# Patient Record
Sex: Female | Born: 1965 | Race: Black or African American | Hispanic: No | Marital: Single | Smoking: Never smoker
Health system: Southern US, Community
[De-identification: ages and names within clinical notes are randomized; demographics above are authoritative.]

## PROBLEM LIST (undated history)

## (undated) DIAGNOSIS — Z923 Personal history of irradiation: Secondary | ICD-10-CM

## (undated) DIAGNOSIS — E119 Type 2 diabetes mellitus without complications: Secondary | ICD-10-CM

## (undated) DIAGNOSIS — C801 Malignant (primary) neoplasm, unspecified: Secondary | ICD-10-CM

## (undated) DIAGNOSIS — I219 Acute myocardial infarction, unspecified: Secondary | ICD-10-CM

## (undated) DIAGNOSIS — I10 Essential (primary) hypertension: Secondary | ICD-10-CM

## (undated) DIAGNOSIS — I2510 Atherosclerotic heart disease of native coronary artery without angina pectoris: Secondary | ICD-10-CM

## (undated) DIAGNOSIS — C50919 Malignant neoplasm of unspecified site of unspecified female breast: Secondary | ICD-10-CM

## (undated) HISTORY — PX: ABDOMINAL HYSTERECTOMY: SHX81

## (undated) HISTORY — DX: Acute myocardial infarction, unspecified: I21.9

---

## 1998-11-19 ENCOUNTER — Emergency Department (HOSPITAL_COMMUNITY): Admission: EM | Admit: 1998-11-19 | Discharge: 1998-11-19 | Payer: Self-pay | Admitting: Emergency Medicine

## 1999-06-25 ENCOUNTER — Emergency Department (HOSPITAL_COMMUNITY): Admission: EM | Admit: 1999-06-25 | Discharge: 1999-06-25 | Payer: Self-pay | Admitting: Emergency Medicine

## 2000-05-26 ENCOUNTER — Other Ambulatory Visit: Admission: RE | Admit: 2000-05-26 | Discharge: 2000-05-26 | Payer: Self-pay | Admitting: Internal Medicine

## 2000-05-29 ENCOUNTER — Encounter: Admission: RE | Admit: 2000-05-29 | Discharge: 2000-05-29 | Payer: Self-pay | Admitting: Internal Medicine

## 2000-05-29 ENCOUNTER — Encounter: Payer: Self-pay | Admitting: Internal Medicine

## 2001-08-20 ENCOUNTER — Other Ambulatory Visit: Admission: RE | Admit: 2001-08-20 | Discharge: 2001-08-20 | Payer: Self-pay | Admitting: Internal Medicine

## 2001-09-12 ENCOUNTER — Other Ambulatory Visit: Admission: RE | Admit: 2001-09-12 | Discharge: 2001-09-12 | Payer: Self-pay | Admitting: Obstetrics and Gynecology

## 2001-12-28 ENCOUNTER — Encounter: Admission: RE | Admit: 2001-12-28 | Discharge: 2001-12-28 | Payer: Self-pay | Admitting: Internal Medicine

## 2001-12-28 ENCOUNTER — Encounter: Payer: Self-pay | Admitting: Internal Medicine

## 2002-02-01 ENCOUNTER — Encounter: Payer: Self-pay | Admitting: Obstetrics and Gynecology

## 2002-02-01 ENCOUNTER — Ambulatory Visit (HOSPITAL_COMMUNITY): Admission: RE | Admit: 2002-02-01 | Discharge: 2002-02-01 | Payer: Self-pay | Admitting: Obstetrics and Gynecology

## 2003-08-19 ENCOUNTER — Encounter: Payer: Self-pay | Admitting: Internal Medicine

## 2003-08-19 ENCOUNTER — Encounter: Admission: RE | Admit: 2003-08-19 | Discharge: 2003-08-19 | Payer: Self-pay | Admitting: Internal Medicine

## 2004-06-23 ENCOUNTER — Encounter: Admission: RE | Admit: 2004-06-23 | Discharge: 2004-06-23 | Payer: Self-pay | Admitting: Internal Medicine

## 2004-10-19 ENCOUNTER — Encounter: Admission: RE | Admit: 2004-10-19 | Discharge: 2004-10-19 | Payer: Self-pay | Admitting: Internal Medicine

## 2004-10-28 ENCOUNTER — Encounter: Admission: RE | Admit: 2004-10-28 | Discharge: 2004-10-28 | Payer: Self-pay | Admitting: Internal Medicine

## 2004-11-09 ENCOUNTER — Ambulatory Visit: Payer: Self-pay | Admitting: Internal Medicine

## 2004-11-12 ENCOUNTER — Ambulatory Visit: Payer: Self-pay | Admitting: Internal Medicine

## 2005-05-31 ENCOUNTER — Encounter: Admission: RE | Admit: 2005-05-31 | Discharge: 2005-05-31 | Payer: Self-pay | Admitting: Internal Medicine

## 2006-09-21 ENCOUNTER — Ambulatory Visit: Payer: Self-pay | Admitting: Internal Medicine

## 2006-09-21 LAB — CONVERTED CEMR LAB
ALT: 12 units/L (ref 0–40)
AST: 14 units/L (ref 0–37)
Alkaline Phosphatase: 33 units/L — ABNORMAL LOW (ref 39–117)
Chol/HDL Ratio, serum: 2.1
Creatinine, Ser: 0.7 mg/dL (ref 0.4–1.2)
Eosinophil percent: 1 % (ref 0.0–5.0)
Folate: 8.8 ng/mL
GFR calc non Af Amer: 98 mL/min
Glucose, Bld: 84 mg/dL (ref 70–99)
HDL: 53 mg/dL (ref >39.0)
Hemoglobin: 7.3 g/dL — CL (ref 12.0–15.0)
Ketones, ur: NEGATIVE mg/dL
Leukocytes, UA: NEGATIVE
Lymphocytes Relative: 45 % (ref 12.0–46.0)
Neutrophils Relative %: 45 % (ref 43.0–77.0)
Platelets: 262 10*3/uL (ref 150–400)
Total Bilirubin: 0.6 mg/dL (ref 0.3–1.2)
Total Protein, Urine: NEGATIVE mg/dL
Triglyceride fasting, serum: 35 mg/dL (ref 0–149)
Urobilinogen, UA: 0.2 (ref 0.0–1.0)
VLDL: 7 mg/dL (ref 0–40)
Vitamin B-12: 1179 pg/mL — ABNORMAL HIGH (ref 211–911)
WBC: 3.6 10*3/uL — ABNORMAL LOW (ref 4.5–10.5)
pH: 6.5 (ref 5.0–8.0)

## 2006-09-25 ENCOUNTER — Ambulatory Visit: Payer: Self-pay | Admitting: Internal Medicine

## 2006-09-26 ENCOUNTER — Encounter: Admission: RE | Admit: 2006-09-26 | Discharge: 2006-09-26 | Payer: Self-pay | Admitting: Internal Medicine

## 2007-05-25 ENCOUNTER — Ambulatory Visit: Payer: Self-pay | Admitting: Internal Medicine

## 2007-05-25 LAB — CONVERTED CEMR LAB
Basophils Relative: 0.2 % (ref 0.0–1.0)
Eosinophils Relative: 0.7 % (ref 0.0–5.0)
HCT: 26.3 % — ABNORMAL LOW (ref 36.0–46.0)
MCV: 73.7 fL — ABNORMAL LOW (ref 78.0–100.0)
Monocytes Absolute: 0.4 10*3/uL (ref 0.2–0.7)
Monocytes Relative: 7.7 % (ref 3.0–11.0)
Neutro Abs: 2.9 10*3/uL (ref 1.4–7.7)
Neutrophils Relative %: 61.3 % (ref 43.0–77.0)
Platelets: 248 10*3/uL (ref 150–400)
RDW: 19.4 % — ABNORMAL HIGH (ref 11.5–14.6)

## 2007-05-29 ENCOUNTER — Ambulatory Visit: Payer: Self-pay | Admitting: Internal Medicine

## 2007-05-29 LAB — CONVERTED CEMR LAB
Basophils Absolute: 0 10*3/uL (ref 0.0–0.1)
Basophils Relative: 0.2 % (ref 0.0–1.0)
Eosinophils Relative: 0.7 % (ref 0.0–5.0)
Folate: 4.2 ng/mL
MCHC: 32.9 g/dL (ref 30.0–36.0)
Monocytes Relative: 5.4 % (ref 3.0–11.0)
Neutrophils Relative %: 64.3 % (ref 43.0–77.0)
Platelets: 243 10*3/uL (ref 150–400)
RBC: 3.49 M/uL — ABNORMAL LOW (ref 3.87–5.11)

## 2007-05-31 ENCOUNTER — Ambulatory Visit: Payer: Self-pay | Admitting: Oncology

## 2007-07-24 ENCOUNTER — Ambulatory Visit: Payer: Self-pay | Admitting: Oncology

## 2007-07-24 LAB — COMPREHENSIVE METABOLIC PANEL
CO2: 25 mEq/L (ref 19–32)
Chloride: 107 mEq/L (ref 96–112)
Creatinine, Ser: 0.74 mg/dL (ref 0.40–1.20)
Glucose, Bld: 90 mg/dL (ref 70–99)
Potassium: 4.1 mEq/L (ref 3.5–5.3)
Sodium: 142 mEq/L (ref 135–145)
Total Protein: 6.5 g/dL (ref 6.0–8.3)

## 2007-07-24 LAB — CBC & DIFF AND RETIC
BASO%: 0.8 % (ref 0.0–2.0)
Eosinophils Absolute: 0 10*3/uL (ref 0.0–0.5)
HGB: 10.6 g/dL — ABNORMAL LOW (ref 11.6–15.9)
MCH: 27.9 pg (ref 26.0–34.0)
MCV: 82.2 fL (ref 81.0–101.0)
MONO#: 0.2 10*3/uL (ref 0.1–0.9)
MONO%: 6.6 % (ref 0.0–13.0)
NEUT%: 49.1 % (ref 39.6–76.8)
RDW: 18.6 % — ABNORMAL HIGH (ref 11.3–14.5)
lymph#: 1.6 10*3/uL (ref 0.9–3.3)

## 2007-07-24 LAB — URINALYSIS, MICROSCOPIC - CHCC
Leukocyte Esterase: NEGATIVE
Nitrite: NEGATIVE
Specific Gravity, Urine: 1.02 (ref 1.003–1.035)
pH: 6 (ref 4.6–8.0)

## 2007-07-26 LAB — IRON AND TIBC
Iron: 118 ug/dL (ref 42–145)
TIBC: 308 ug/dL (ref 250–470)
UIBC: 190 ug/dL

## 2007-07-26 LAB — PROTEIN ELECTROPHORESIS, SERUM
Alpha-1-Globulin: 5.3 % — ABNORMAL HIGH (ref 2.9–4.9)
Alpha-2-Globulin: 8.1 % (ref 7.1–11.8)
Beta Globulin: 7.1 % (ref 4.7–7.2)
Gamma Globulin: 18.6 % (ref 11.1–18.8)

## 2007-07-26 LAB — HEMOGLOBINOPATHY EVALUATION
Hgb A2 Quant: 2.7 % (ref 2.2–3.2)
Hgb A: 58.8 % — ABNORMAL LOW (ref 96.8–97.8)

## 2007-07-26 LAB — FOLATE: Folate: >20 ng/mL

## 2007-07-26 LAB — FERRITIN: Ferritin: 8 ng/mL — ABNORMAL LOW (ref 10–291)

## 2007-07-26 LAB — HAPTOGLOBIN: Haptoglobin: 129 mg/dL (ref 16–200)

## 2007-07-26 LAB — VITAMIN B12: Vitamin B-12: >2000 pg/mL — ABNORMAL HIGH (ref 211–911)

## 2007-08-01 LAB — VON WILLEBRAND FACTOR MULTIMER
Factor-VIII Activity: 99 % (ref 75–150)
Ristocetin-Cofactor: 91 % (ref 50–150)
Von Willebrand Ag: 112 % normal (ref 61–164)

## 2007-09-04 LAB — CBC WITH DIFFERENTIAL/PLATELET
BASO%: 0.1 % (ref 0.0–2.0)
EOS%: 0.8 % (ref 0.0–7.0)
HCT: 31.6 % — ABNORMAL LOW (ref 34.8–46.6)
LYMPH%: 40.1 % (ref 14.0–48.0)
MCH: 31.2 pg (ref 26.0–34.0)
MCHC: 35.5 g/dL (ref 32.0–36.0)
MONO#: 0.2 10*3/uL (ref 0.1–0.9)
MONO%: 5.5 % (ref 0.0–13.0)
NEUT%: 53.5 % (ref 39.6–76.8)
Platelets: 189 10*3/uL (ref 145–400)
RBC: 3.6 10*6/uL — ABNORMAL LOW (ref 3.70–5.32)
WBC: 3.9 10*3/uL (ref 3.9–10.0)

## 2007-09-04 LAB — BASIC METABOLIC PANEL
BUN: 13 mg/dL (ref 6–23)
CO2: 26 mEq/L (ref 19–32)
Chloride: 105 mEq/L (ref 96–112)
Creatinine, Ser: 0.69 mg/dL (ref 0.40–1.20)
Glucose, Bld: 83 mg/dL (ref 70–99)
Potassium: 3.9 mEq/L (ref 3.5–5.3)

## 2007-09-04 LAB — IRON AND TIBC: Iron: 62 ug/dL (ref 42–145)

## 2007-09-11 ENCOUNTER — Ambulatory Visit: Payer: Self-pay | Admitting: Oncology

## 2007-09-11 LAB — CBC WITH DIFFERENTIAL/PLATELET
BASO%: 0.3 % (ref 0.0–2.0)
Basophils Absolute: 0 10*3/uL (ref 0.0–0.1)
EOS%: 0.7 % (ref 0.0–7.0)
HCT: 29.9 % — ABNORMAL LOW (ref 34.8–46.6)
MCH: 31.7 pg (ref 26.0–34.0)
MCHC: 35.7 g/dL (ref 32.0–36.0)
MCV: 88.7 fL (ref 81.0–101.0)
MONO%: 5.6 % (ref 0.0–13.0)
NEUT%: 56.7 % (ref 39.6–76.8)
RDW: 15.3 % — ABNORMAL HIGH (ref 11.3–14.5)
lymph#: 1.3 10*3/uL (ref 0.9–3.3)

## 2007-09-14 LAB — HYPERCOAGULABLE PANEL, COMPREHENSIVE
Anticardiolipin IgM: <7 [MPL'U] (ref <10)
Beta-2 Glyco I IgG: <4 U/mL (ref <20)
Beta-2-Glycoprotein I IgM: <4 U/mL (ref <10)
Homocysteine: 8.2 umol/L (ref 4.0–15.4)
Protein C Activity: 101 % (ref 75–133)
Protein C, Total: 70 % (ref 70–140)
Protein S Activity: 133 % — ABNORMAL HIGH (ref 69–129)
Protein S Ag, Total: 82 % (ref 70–140)

## 2007-09-14 LAB — PROTEIN ELECTROPHORESIS, SERUM
Alpha-1-Globulin: 4.1 % (ref 2.9–4.9)
Beta 2: 4.5 % (ref 3.2–6.5)
Gamma Globulin: 18.3 % (ref 11.1–18.8)

## 2007-09-14 LAB — VISCOSITY, SERUM: Viscosity, Serum: 1 mPa.S — ABNORMAL LOW (ref 1.1–2.0)

## 2007-10-02 ENCOUNTER — Encounter: Admission: RE | Admit: 2007-10-02 | Discharge: 2007-10-02 | Payer: Self-pay | Admitting: Internal Medicine

## 2007-10-02 ENCOUNTER — Ambulatory Visit: Payer: Self-pay | Admitting: Internal Medicine

## 2007-10-02 LAB — CONVERTED CEMR LAB
Albumin: 3.4 g/dL — ABNORMAL LOW (ref 3.5–5.2)
BUN: 7 mg/dL (ref 6–23)
Basophils Relative: 0.6 % (ref 0.0–1.0)
Bilirubin Urine: NEGATIVE
Bilirubin, Direct: 0.1 mg/dL (ref 0.0–0.3)
Calcium: 8.5 mg/dL (ref 8.4–10.5)
Cholesterol: 105 mg/dL (ref 0–200)
Creatinine, Ser: 0.7 mg/dL (ref 0.4–1.2)
Crystals: NEGATIVE
HCT: 32.1 % — ABNORMAL LOW (ref 36.0–46.0)
Hemoglobin: 11 g/dL — ABNORMAL LOW (ref 12.0–15.0)
LDL Cholesterol: 53 mg/dL (ref 0–99)
Monocytes Absolute: 0.2 10*3/uL (ref 0.2–0.7)
Neutro Abs: 1.8 10*3/uL (ref 1.4–7.7)
Neutrophils Relative %: 49.9 % (ref 43.0–77.0)
Potassium: 3.9 meq/L (ref 3.5–5.1)
TSH: 1.61 microintl units/mL (ref 0.35–5.50)
Total Bilirubin: 0.4 mg/dL (ref 0.3–1.2)
Total Protein, Urine: 30 mg/dL — AB
Urine Glucose: NEGATIVE mg/dL
VLDL: 11 mg/dL (ref 0–40)
WBC: 3.4 10*3/uL — ABNORMAL LOW (ref 4.5–10.5)
pH: 8 (ref 5.0–8.0)

## 2007-10-08 ENCOUNTER — Encounter: Payer: Self-pay | Admitting: Internal Medicine

## 2007-10-08 ENCOUNTER — Ambulatory Visit: Payer: Self-pay | Admitting: Internal Medicine

## 2007-10-08 DIAGNOSIS — D573 Sickle-cell trait: Secondary | ICD-10-CM | POA: Insufficient documentation

## 2007-10-08 DIAGNOSIS — Z862 Personal history of diseases of the blood and blood-forming organs and certain disorders involving the immune mechanism: Secondary | ICD-10-CM | POA: Insufficient documentation

## 2007-10-08 DIAGNOSIS — I10 Essential (primary) hypertension: Secondary | ICD-10-CM | POA: Insufficient documentation

## 2007-10-08 DIAGNOSIS — Z8639 Personal history of other endocrine, nutritional and metabolic disease: Secondary | ICD-10-CM

## 2007-10-08 DIAGNOSIS — D57819 Other sickle-cell disorders with crisis, unspecified: Secondary | ICD-10-CM

## 2007-10-08 DIAGNOSIS — D509 Iron deficiency anemia, unspecified: Secondary | ICD-10-CM | POA: Insufficient documentation

## 2008-01-01 ENCOUNTER — Ambulatory Visit: Payer: Self-pay | Admitting: Oncology

## 2008-01-03 LAB — CBC WITH DIFFERENTIAL/PLATELET
BASO%: 0.3 % (ref 0.0–2.0)
EOS%: 0.3 % (ref 0.0–7.0)
HCT: 33.5 % — ABNORMAL LOW (ref 34.8–46.6)
MCH: 31.2 pg (ref 26.0–34.0)
MCHC: 34 g/dL (ref 32.0–36.0)
MONO#: 0.3 10*3/uL (ref 0.1–0.9)
NEUT%: 53.4 % (ref 39.6–76.8)
RBC: 3.65 10*6/uL — ABNORMAL LOW (ref 3.70–5.32)
WBC: 3.9 10*3/uL (ref 3.9–10.0)
lymph#: 1.5 10*3/uL (ref 0.9–3.3)

## 2008-01-03 LAB — IRON AND TIBC: UIBC: 229 ug/dL

## 2008-03-05 ENCOUNTER — Ambulatory Visit: Payer: Self-pay | Admitting: Hematology and Oncology

## 2008-03-07 ENCOUNTER — Encounter: Payer: Self-pay | Admitting: Internal Medicine

## 2008-04-28 ENCOUNTER — Telehealth (INDEPENDENT_AMBULATORY_CARE_PROVIDER_SITE_OTHER): Payer: Self-pay | Admitting: *Deleted

## 2008-11-10 ENCOUNTER — Encounter: Admission: RE | Admit: 2008-11-10 | Discharge: 2008-11-10 | Payer: Self-pay | Admitting: Internal Medicine

## 2008-11-10 ENCOUNTER — Ambulatory Visit: Payer: Self-pay | Admitting: Internal Medicine

## 2008-11-11 LAB — CONVERTED CEMR LAB
ALT: 11 units/L (ref 0–35)
AST: 16 units/L (ref 0–37)
Basophils Absolute: 0 10*3/uL (ref 0.0–0.1)
Bilirubin, Direct: 0.1 mg/dL (ref 0.0–0.3)
Cholesterol: 129 mg/dL (ref 0–200)
Crystals: NEGATIVE
HCT: 32.7 % — ABNORMAL LOW (ref 36.0–46.0)
HDL: 53.8 mg/dL (ref >39.0)
Hemoglobin, Urine: NEGATIVE
Hemoglobin: 10.9 g/dL — ABNORMAL LOW (ref 12.0–15.0)
LDL Cholesterol: 69 mg/dL (ref 0–99)
Lymphocytes Relative: 37.8 % (ref 12.0–46.0)
Neutrophils Relative %: 52.8 % (ref 43.0–77.0)
Potassium: 4 meq/L (ref 3.5–5.1)
RBC / HPF: NONE SEEN
RBC: 3.5 M/uL — ABNORMAL LOW (ref 3.87–5.11)
RDW: 12.8 % (ref 11.5–14.6)
Saturation Ratios: 16.3 % — ABNORMAL LOW (ref 20.0–50.0)
Sodium: 144 meq/L (ref 135–145)
Total Bilirubin: 0.7 mg/dL (ref 0.3–1.2)
Total CHOL/HDL Ratio: 2.4
Transferrin: 249.2 mg/dL (ref >=212.0)
VLDL: 7 mg/dL (ref 0–40)
Vitamin B-12: >1500 pg/mL — ABNORMAL HIGH (ref 211–911)

## 2008-11-13 ENCOUNTER — Telehealth (INDEPENDENT_AMBULATORY_CARE_PROVIDER_SITE_OTHER): Payer: Self-pay | Admitting: *Deleted

## 2009-03-06 ENCOUNTER — Emergency Department (HOSPITAL_BASED_OUTPATIENT_CLINIC_OR_DEPARTMENT_OTHER): Admission: EM | Admit: 2009-03-06 | Discharge: 2009-03-06 | Payer: Self-pay | Admitting: Emergency Medicine

## 2009-03-09 ENCOUNTER — Telehealth: Payer: Self-pay | Admitting: Internal Medicine

## 2009-03-10 ENCOUNTER — Ambulatory Visit: Payer: Self-pay | Admitting: Internal Medicine

## 2009-03-10 LAB — CONVERTED CEMR LAB
Eosinophils Relative: 1.6 % (ref 0.0–5.0)
HCT: 33.1 % — ABNORMAL LOW (ref 36.0–46.0)
Lymphocytes Relative: 41.8 % (ref 12.0–46.0)
MCHC: 33.7 g/dL (ref 30.0–36.0)
MCV: 90.1 fL (ref 78.0–100.0)
Neutrophils Relative %: 48.2 % (ref 43.0–77.0)
Platelets: 184 10*3/uL (ref 150.0–400.0)
WBC: 3.7 10*3/uL — ABNORMAL LOW (ref 4.5–10.5)

## 2009-03-17 ENCOUNTER — Ambulatory Visit: Payer: Self-pay | Admitting: Internal Medicine

## 2009-03-17 DIAGNOSIS — R509 Fever, unspecified: Secondary | ICD-10-CM

## 2009-03-17 LAB — CONVERTED CEMR LAB
Bilirubin Urine: NEGATIVE
Hemoglobin, Urine: NEGATIVE
Ketones, ur: NEGATIVE mg/dL
Total Protein, Urine: NEGATIVE mg/dL
Urine Glucose: NEGATIVE mg/dL
Urobilinogen, UA: 1 (ref 0.0–1.0)

## 2009-03-18 ENCOUNTER — Encounter: Payer: Self-pay | Admitting: Internal Medicine

## 2009-06-08 ENCOUNTER — Telehealth: Payer: Self-pay | Admitting: Internal Medicine

## 2009-09-04 ENCOUNTER — Telehealth: Payer: Self-pay | Admitting: Internal Medicine

## 2009-11-06 ENCOUNTER — Encounter: Admission: RE | Admit: 2009-11-06 | Discharge: 2009-11-06 | Payer: Self-pay | Admitting: Internal Medicine

## 2010-06-25 ENCOUNTER — Ambulatory Visit (HOSPITAL_COMMUNITY): Admission: RE | Admit: 2010-06-25 | Discharge: 2010-06-25 | Payer: Self-pay | Admitting: Obstetrics and Gynecology

## 2010-11-22 ENCOUNTER — Encounter
Admission: RE | Admit: 2010-11-22 | Discharge: 2010-11-22 | Payer: Self-pay | Source: Home / Self Care | Attending: Family Medicine | Admitting: Family Medicine

## 2011-01-01 ENCOUNTER — Encounter: Payer: Self-pay | Admitting: Internal Medicine

## 2011-03-24 LAB — URINALYSIS, ROUTINE W REFLEX MICROSCOPIC
Bilirubin Urine: NEGATIVE
Glucose, UA: NEGATIVE mg/dL
Protein, ur: NEGATIVE mg/dL
pH: 7 (ref 5.0–8.0)

## 2011-03-24 LAB — URINE MICROSCOPIC-ADD ON

## 2011-03-24 LAB — PREGNANCY, URINE: Preg Test, Ur: NEGATIVE

## 2011-12-13 DIAGNOSIS — I219 Acute myocardial infarction, unspecified: Secondary | ICD-10-CM

## 2011-12-13 HISTORY — DX: Acute myocardial infarction, unspecified: I21.9

## 2012-03-02 ENCOUNTER — Other Ambulatory Visit: Payer: Self-pay | Admitting: Family Medicine

## 2012-03-02 DIAGNOSIS — Z1231 Encounter for screening mammogram for malignant neoplasm of breast: Secondary | ICD-10-CM

## 2012-03-27 ENCOUNTER — Ambulatory Visit: Payer: Self-pay

## 2012-05-15 ENCOUNTER — Ambulatory Visit
Admission: RE | Admit: 2012-05-15 | Discharge: 2012-05-15 | Disposition: A | Discharge status: Home / Self Care | Payer: BC Managed Care – PPO | Source: Ambulatory Visit | Attending: Family Medicine | Admitting: Family Medicine

## 2012-05-15 DIAGNOSIS — Z1231 Encounter for screening mammogram for malignant neoplasm of breast: Secondary | ICD-10-CM

## 2013-06-10 ENCOUNTER — Other Ambulatory Visit: Payer: Self-pay | Admitting: Family Medicine

## 2013-06-10 DIAGNOSIS — Z1231 Encounter for screening mammogram for malignant neoplasm of breast: Secondary | ICD-10-CM

## 2013-06-24 ENCOUNTER — Ambulatory Visit: Payer: BC Managed Care – PPO

## 2015-03-19 ENCOUNTER — Other Ambulatory Visit: Payer: Self-pay | Admitting: Family Medicine

## 2015-03-19 DIAGNOSIS — N63 Unspecified lump in unspecified breast: Secondary | ICD-10-CM

## 2015-03-23 ENCOUNTER — Ambulatory Visit
Admission: RE | Admit: 2015-03-23 | Discharge: 2015-03-23 | Disposition: A | Discharge status: Home / Self Care | Payer: BLUE CROSS/BLUE SHIELD | Source: Ambulatory Visit | Attending: Family Medicine | Admitting: Family Medicine

## 2015-03-23 ENCOUNTER — Other Ambulatory Visit: Payer: Self-pay | Admitting: Family Medicine

## 2015-03-23 DIAGNOSIS — N63 Unspecified lump in unspecified breast: Secondary | ICD-10-CM

## 2015-03-23 DIAGNOSIS — R928 Other abnormal and inconclusive findings on diagnostic imaging of breast: Secondary | ICD-10-CM

## 2016-08-01 ENCOUNTER — Other Ambulatory Visit: Payer: Self-pay | Admitting: Family Medicine

## 2016-08-01 DIAGNOSIS — Z1231 Encounter for screening mammogram for malignant neoplasm of breast: Secondary | ICD-10-CM

## 2016-08-05 ENCOUNTER — Ambulatory Visit
Admission: RE | Admit: 2016-08-05 | Discharge: 2016-08-05 | Disposition: A | Discharge status: Home / Self Care | Payer: Managed Care, Other (non HMO) | Source: Ambulatory Visit | Attending: Family Medicine | Admitting: Family Medicine

## 2016-08-05 DIAGNOSIS — Z1231 Encounter for screening mammogram for malignant neoplasm of breast: Secondary | ICD-10-CM

## 2017-11-13 ENCOUNTER — Other Ambulatory Visit: Payer: Self-pay | Admitting: Family Medicine

## 2017-11-13 DIAGNOSIS — Z139 Encounter for screening, unspecified: Secondary | ICD-10-CM

## 2017-12-12 HISTORY — PX: BREAST LUMPECTOMY: SHX2

## 2017-12-15 ENCOUNTER — Ambulatory Visit
Admission: RE | Admit: 2017-12-15 | Discharge: 2017-12-15 | Disposition: A | Discharge status: Home / Self Care | Payer: Managed Care, Other (non HMO) | Source: Ambulatory Visit | Attending: Family Medicine | Admitting: Family Medicine

## 2017-12-15 DIAGNOSIS — Z139 Encounter for screening, unspecified: Secondary | ICD-10-CM

## 2017-12-18 ENCOUNTER — Other Ambulatory Visit: Payer: Self-pay | Admitting: Family Medicine

## 2017-12-18 DIAGNOSIS — R928 Other abnormal and inconclusive findings on diagnostic imaging of breast: Secondary | ICD-10-CM

## 2017-12-27 ENCOUNTER — Ambulatory Visit
Admission: RE | Admit: 2017-12-27 | Discharge: 2017-12-27 | Disposition: A | Discharge status: Home / Self Care | Payer: BLUE CROSS/BLUE SHIELD | Source: Ambulatory Visit | Attending: Family Medicine | Admitting: Family Medicine

## 2017-12-27 ENCOUNTER — Other Ambulatory Visit: Payer: Self-pay | Admitting: Family Medicine

## 2017-12-27 DIAGNOSIS — R928 Other abnormal and inconclusive findings on diagnostic imaging of breast: Secondary | ICD-10-CM

## 2017-12-27 DIAGNOSIS — R921 Mammographic calcification found on diagnostic imaging of breast: Secondary | ICD-10-CM

## 2018-01-01 ENCOUNTER — Ambulatory Visit
Admission: RE | Admit: 2018-01-01 | Discharge: 2018-01-01 | Disposition: A | Discharge status: Home / Self Care | Payer: BLUE CROSS/BLUE SHIELD | Source: Ambulatory Visit | Attending: Family Medicine | Admitting: Family Medicine

## 2018-01-01 DIAGNOSIS — R921 Mammographic calcification found on diagnostic imaging of breast: Secondary | ICD-10-CM

## 2018-01-02 ENCOUNTER — Other Ambulatory Visit: Payer: Self-pay | Admitting: Family Medicine

## 2018-01-02 DIAGNOSIS — R921 Mammographic calcification found on diagnostic imaging of breast: Secondary | ICD-10-CM

## 2018-01-04 ENCOUNTER — Ambulatory Visit
Admission: RE | Admit: 2018-01-04 | Discharge: 2018-01-04 | Disposition: A | Discharge status: Home / Self Care | Payer: BLUE CROSS/BLUE SHIELD | Source: Ambulatory Visit | Attending: Family Medicine | Admitting: Family Medicine

## 2018-01-04 DIAGNOSIS — R921 Mammographic calcification found on diagnostic imaging of breast: Secondary | ICD-10-CM

## 2018-01-09 ENCOUNTER — Telehealth: Payer: Self-pay | Admitting: Hematology

## 2018-01-09 ENCOUNTER — Encounter: Payer: Self-pay | Admitting: Hematology

## 2018-01-09 NOTE — Telephone Encounter (Signed)
Appt has been scheduled for the pt to see Dr. Burr Medico on 2/15 at 230pm. Pt preferred Friday afternoon appts. She has agreed to the appt date and time. Aware to arrive 30 minutes early. Letter mailed.

## 2018-01-11 ENCOUNTER — Encounter: Payer: Self-pay | Admitting: Radiation Oncology

## 2018-01-13 ENCOUNTER — Other Ambulatory Visit: Payer: Self-pay

## 2018-01-13 ENCOUNTER — Encounter (HOSPITAL_BASED_OUTPATIENT_CLINIC_OR_DEPARTMENT_OTHER): Payer: Self-pay | Admitting: *Deleted

## 2018-01-13 DIAGNOSIS — Z79899 Other long term (current) drug therapy: Secondary | ICD-10-CM | POA: Insufficient documentation

## 2018-01-13 DIAGNOSIS — I10 Essential (primary) hypertension: Secondary | ICD-10-CM | POA: Insufficient documentation

## 2018-01-13 DIAGNOSIS — Z7982 Long term (current) use of aspirin: Secondary | ICD-10-CM | POA: Diagnosis not present

## 2018-01-13 DIAGNOSIS — J069 Acute upper respiratory infection, unspecified: Secondary | ICD-10-CM | POA: Insufficient documentation

## 2018-01-13 DIAGNOSIS — I2510 Atherosclerotic heart disease of native coronary artery without angina pectoris: Secondary | ICD-10-CM | POA: Diagnosis not present

## 2018-01-13 DIAGNOSIS — R05 Cough: Secondary | ICD-10-CM | POA: Diagnosis present

## 2018-01-13 DIAGNOSIS — D573 Sickle-cell trait: Secondary | ICD-10-CM | POA: Insufficient documentation

## 2018-01-13 DIAGNOSIS — E119 Type 2 diabetes mellitus without complications: Secondary | ICD-10-CM | POA: Diagnosis not present

## 2018-01-13 NOTE — ED Triage Notes (Signed)
Pt reports URI sx x 1 week. Endorses fever earlier in week. States tonight she began having difficulty breathing

## 2018-01-14 ENCOUNTER — Emergency Department (HOSPITAL_BASED_OUTPATIENT_CLINIC_OR_DEPARTMENT_OTHER): Payer: BLUE CROSS/BLUE SHIELD

## 2018-01-14 ENCOUNTER — Emergency Department (HOSPITAL_BASED_OUTPATIENT_CLINIC_OR_DEPARTMENT_OTHER)
Admission: EM | Admit: 2018-01-14 | Discharge: 2018-01-14 | Disposition: A | Discharge status: Home / Self Care | Payer: BLUE CROSS/BLUE SHIELD | Attending: Emergency Medicine | Admitting: Emergency Medicine

## 2018-01-14 DIAGNOSIS — B9789 Other viral agents as the cause of diseases classified elsewhere: Secondary | ICD-10-CM

## 2018-01-14 DIAGNOSIS — J069 Acute upper respiratory infection, unspecified: Secondary | ICD-10-CM

## 2018-01-14 HISTORY — DX: Type 2 diabetes mellitus without complications: E11.9

## 2018-01-14 HISTORY — DX: Atherosclerotic heart disease of native coronary artery without angina pectoris: I25.10

## 2018-01-14 HISTORY — DX: Essential (primary) hypertension: I10

## 2018-01-14 MED ORDER — IBUPROFEN 600 MG PO TABS: 600.0000 mg | ORAL_TABLET | Freq: Four times a day (QID) | ORAL | 0 refills | Status: DC | PRN

## 2018-01-14 MED ORDER — BENZONATATE 100 MG PO CAPS: 100.0000 mg | ORAL_CAPSULE | Freq: Three times a day (TID) | ORAL | 0 refills | Status: DC

## 2018-01-14 NOTE — ED Notes (Signed)
Pt discharged to home NAD.  

## 2018-01-14 NOTE — ED Notes (Signed)
Alert, NAD, calm, interactive, resps e/u, speaking in clear complete sentences, no dyspnea noted, skin W&D, VSS, c/o productive cough with nasal and chest congestion, some sob, recent fever resolved, (denies: pain, NVD, sore throat, facial pain, ear ache, bleeding, dizziness or visual changes).

## 2018-01-14 NOTE — ED Notes (Signed)
To x-ray

## 2018-01-14 NOTE — ED Provider Notes (Signed)
[place] EMERGENCY DEPARTMENT Provider Note   CSN: [id number] Arrival date & time: 01/13/18  2244     History   Chief Complaint Chief Complaint  Patient presents with  . Cough    HPI Deanna Roberts is a 52 y.o. female.  HPI  This is a 52 year old female with history of coronary artery disease, diabetes, hypertension who presents with 1 week history of congestion and cough.  Patient reports nonproductive cough over the last week.  Worsened over the last 24 hours with shortness of breath.  Denies any chest pain.  Does report upper respiratory congestion and sore throat.  Chills without documented fevers.  She has not tried anything over-the-counter for her symptoms.  She is a non-smoker.  No history of COPD.  Past Medical History:  Diagnosis Date  . Coronary artery disease   . Diabetes mellitus without complication ([place])   . Hypertension     Patient Active Problem List   Diagnosis Date Noted  . FEVER UNSPECIFIED 03/17/2009  . ANEMIA-IRON DEFICIENCY 10/08/2007  . SICKLE CELL TRAIT 10/08/2007  . DISEASE, SICKLE CELL W/CRISIS NEC 10/08/2007  . HYPERTENSION 10/08/2007  . GLUCOSE INTOLERANCE, HX OF 10/08/2007    Past Surgical History:  Procedure Laterality Date  . ABDOMINAL HYSTERECTOMY      OB History    No data available       Home Medications    Prior to Admission medications   Medication Sig Start Date End Date Taking? Authorizing Provider  aspirin 81 MG chewable tablet Chew 81 mg by mouth daily.   Yes [provider]  hydrochlorothiazide (HYDRODIURIL) 25 MG tablet Take 25 mg by mouth daily.   Yes [provider]  lisinopril (PRINIVIL,ZESTRIL) 10 MG tablet Take 10 mg by mouth daily.   Yes [provider]  benzonatate (TESSALON) 100 MG capsule Take 1 capsule (100 mg total) by mouth every 8 (eight) hours. 01/14/18   Monetta Lick, Barbette Hair, MD  ibuprofen (ADVIL,MOTRIN) 600 MG tablet Take 1 tablet (600 mg total) by mouth every 6  (six) hours as needed. 01/14/18   Jordin Vicencio, Barbette Hair, MD    Family History Family History  Problem Relation Age of Onset  . Breast cancer Neg Hx     Social History Social History   Tobacco Use  . Smoking status: Never Smoker  . Smokeless tobacco: Never Used  Substance Use Topics  . Alcohol use: Yes    Comment: occasional  . Drug use: No     Allergies   Patient has no known allergies.   Review of Systems Review of Systems  Constitutional: Negative for chills and fever.  HENT: Positive for congestion and sore throat.   Respiratory: Positive for cough and shortness of breath.   Cardiovascular: Negative for chest pain.  Gastrointestinal: Negative for abdominal pain, diarrhea, nausea and vomiting.  Genitourinary: Negative for dysuria.  All other systems reviewed and are negative.    Physical Exam Updated Vital Signs BP (!) 143/91   Pulse 83   Temp 98.6 F (37 C) (Oral)   Resp 18   SpO2 100%   Physical Exam  Constitutional: She is oriented to person, place, and time. She appears well-developed and well-nourished. No distress.  HENT:  Head: Normocephalic and atraumatic.  Mouth/Throat: Oropharynx is clear and moist. No oropharyngeal exudate.  Oropharynx moist and clear, uvula midline, mild erythema noted  Cardiovascular: Normal rate, regular rhythm and normal heart sounds.  No murmur heard. Pulmonary/Chest: Effort normal and breath sounds  normal. No respiratory distress. She has no wheezes.  Abdominal: Soft. Bowel sounds are normal. There is no tenderness.  Neurological: She is alert and oriented to person, place, and time.  Skin: Skin is warm and dry.  Psychiatric: She has a normal mood and affect.  Nursing note and vitals reviewed.    ED Treatments / Results  Labs (all labs ordered are listed, but only abnormal results are displayed) Labs Reviewed - No data to display  EKG  EKG Interpretation None       Radiology Dg Chest 2 View  Result Date:  01/14/2018 CLINICAL DATA:  Shortness of breath today.  Cough.  Previous smoker. EXAM: CHEST  2 VIEW COMPARISON:  09/26/2012 FINDINGS: The heart size and mediastinal contours are within normal limits. Both lungs are clear. The visualized skeletal structures are unremarkable. IMPRESSION: No active cardiopulmonary disease. Electronically Signed   By: Lucienne Capers M.D.   On: 01/14/2018 01:11    Procedures Procedures (including critical care time)  Medications Ordered in ED Medications - No data to display   Initial Impression / Assessment and Plan / ED Course  I have reviewed the triage vital signs and the nursing notes.  Pertinent labs & imaging results that were available during my care of the patient were reviewed by me and considered in my medical decision making (see chart for details).     Patient presents with cough in the setting of upper respiratory symptoms.  She is nontoxic appearing.  Afebrile.  Vital signs are reassuring.  Exam is benign.  She has no wheezing.  No history of smoking or COPD.  Suspect viral etiology.  Low suspicion at this time for influenza.  Screening x-ray was obtained to evaluate for pneumonia given duration of cough.  X-ray is negative.  Recommend supportive measures.  After history, exam, and medical workup I feel the patient has been appropriately medically screened and is safe for discharge home. Pertinent diagnoses were discussed with the patient. Patient was given return precautions.   Final Clinical Impressions(s) / ED Diagnoses   Final diagnoses:  Viral URI with cough    ED Discharge Orders        Ordered    ibuprofen (ADVIL,MOTRIN) 600 MG tablet  Every 6 hours PRN     01/14/18 0136    benzonatate (TESSALON) 100 MG capsule  Every 8 hours     01/14/18 0136       Iliany Losier, Barbette Hair, MD 01/14/18 0200

## 2018-01-14 NOTE — ED Notes (Signed)
EDP into room, prior to RN assessment, see MD notes, pending orders.   

## 2018-01-14 NOTE — Discharge Instructions (Signed)
You were seen today for upper respiratory symptoms and cough.  This is likely viral in nature.  Supportive measures including hydration, ibuprofen, Tessalon Perles.  If you develop any new or worsening symptoms you should be reevaluated by her primary physician.

## 2018-01-25 NOTE — Progress Notes (Signed)
Location of Breast Cancer: Left Breast  Histology per Pathology Report:  01/01/18 Diagnosis Breast, left, needle core biopsy, UOQ - DUCTAL CARCINOMA IN SITU WITH CALCIFICATIONS Receptor Status: ER(90%), PR (30%)  01/04/18 Diagnosis Breast, left, needle core biopsy, upper outer quadrant at posterior depth - DUCTAL CARCINOMA IN SITU WITH CALCIFICATIONS, SEE COMMENT. Receptor status: ER (90%), PR (2%).   Did patient present with symptoms or was this found on screening mammography?: It was found on a screening mammogram.   Past/Anticipated interventions by surgeon, if any: Dr. Brantley Stage. He referred patient to Medical, Radiation, and Plastic Surgery. She saw the plastic surgeon on 01/19/18  Past/Anticipated interventions by medical oncology, if any:  Dr. Burr Medico 01/26/18 Plan -f/u appointment is open depending on her decision about surgery and participating of COMET clinical trial  -genetic referral    Lymphedema issues, if any:  N/A  Pain issues, if any:  She denies  SAFETY ISSUES:  Prior radiation? No  Pacemaker/ICD? No  Possible current pregnancy? No, hx of hysterectomy  Is the patient on methotrexate? No  Current Complaints / other details:    BP 120/72   Pulse 67   Temp 98.1 F (36.7 C)   Ht 5\' 7"  (1.702 m)   Wt 139 lb (63 kg)   SpO2 100% Comment: room air  BMI 21.77 kg/m    Wt Readings from Last 3 Encounters:  01/30/18 139 lb (63 kg)  01/26/18 138 lb 3.2 oz (62.7 kg)  10/08/07 158 lb (71.7 kg)      Taunia Frasco, Stephani Police, RN 01/25/2018,1:33 PM

## 2018-01-25 NOTE — Progress Notes (Signed)
Deanna Roberts  [phone] [phone]  Clinic New Consult Note   Patient Care Team: Patient, No Pcp Per as PCP - General (General Practice) 01/26/2018  Referring physician: Dr. Brantley Stage  CHIEF COMPLAINTS/PURPOSE OF CONSULTATION:  Newly diagnosed DCIS  Oncology History   Cancer Staging Ductal carcinoma in situ (DCIS) of left breast Staging form: Breast, AJCC 8th Edition - Clinical stage from 01/04/2018: Stage 0 (cTis (DCIS), cN0, cM0, ER: Positive, PR: Positive, HER2: Not assessed ) - Signed by Truitt Merle, MD on 01/26/2018       Ductal carcinoma in situ (DCIS) of left breast   12/27/2017 Mammogram    IMPRESSION: Indeterminate calcifications in the upper-outer quadrant of the left breast.      01/04/2018 Receptors her2    Results:Breast, left, needle core biopsy, upper outer quadrant at posterior depth Estrogen Receptor: 95%, POSITIVE, MODERATE STAINING INTENSITY Progesterone Receptor: 2%, POSITIVE, WEAK STAINING INTENSITY Results: Breast, left, needle core biopsy, UOQ Estrogen Receptor: 100%, POSITIVE, STRONG STAINING INTENSITY Progesterone Receptor: 30%, POSITIVE, MODERATE STAINING INTENSITY      01/04/2018 Initial Biopsy    Diagnosis 01/04/18 Breast, left, needle core biopsy, upper outer quadrant at posterior depth - DUCTAL CARCINOMA IN SITU WITH CALCIFICATIONS, SEE COMMENT. Microscopic Comment The carcinoma appears intermediate grade. Prognostic markers will be ordered. The case was called to [facility] on 01/05/2018. Diagnosis 01/04/18 Breast, left, needle core biopsy, UOQ - DUCTAL CARCINOMA IN SITU WITH CALCIFICATIONS. - SEE COMMENT. Microscopic Comment The carcinoma appears intermediate grade. Estrogen receptor and progesterone receptor studies will be performed and the results reported separately. The results were called to the [facility] on 01/02/2018.           HISTORY OF PRESENTING  ILLNESS:  Deanna Roberts 52 y.o. female is a here because of newly diagnosed DCIS. The patient was referred by Dr. Donella Stade. The patient presents to the clinic today by herself.   She reports she did not feel a lump or any abnormality. She has been compliant with her screening mammograms yearly. She has had to have a repeat mammogram before due to her dense breast tissue at C.   Pt had a diagnostic mammogram on 12/27/17 that revealed indeterminate calcifications in the upper-outer quadrant of the left breast. Her initial biopsy results revealed intermediate grade ductal carcinoma in situ with calcifications in her left breast.   Her ER/PR indicators are as follows.   Breast, left, needle core biopsy, upper outer quadrant at posterior depth: Estrogen Receptor: 95%, POSITIVE, MODERATE STAINING INTENSITY. Progesterone Receptor: 2%, POSITIVE, WEAK STAINING INTENSITY Breast, left, needle core biopsy, UOQ: Estrogen Receptor: 100%, POSITIVE, STRONG STAINING INTENSITY. Progesterone Receptor: 30%, POSITIVE, MODERATE STAINING INTENSITY  In the past the patient was diagnosed with significant comorbidities of controlled HTN and DM. 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 She reports that she has sever joint pain that she takes hydrocodone   She has a FHx of MI and CAD by her mother. Her mother also had Breast cancer diagnosis in her 65s. She has no other Fhx of CA. She has a FHx of MS by her two sisters.   Socially she is single and works at [employer] as a Education officer, museum.   GYN HISTORY  Menarchal: 12 LMP: She is unsure. She had hot flashes in her 14s but she was using Mirena. She is s/p complete hysterectomy so she is post-menopausal now Contraceptive: complete hysterectomy 2 years ago for  fibriods HRT: None  GP: pregnancy once (at the age of 2), 0 children   MEDICAL HISTORY:  Past Medical History:  Diagnosis Date  . Coronary artery disease   . Diabetes mellitus without complication ([place])    . Hypertension   . Myocardial infarction [facility])     SURGICAL HISTORY: Past Surgical History:  Procedure Laterality Date  . ABDOMINAL HYSTERECTOMY      SOCIAL HISTORY: Social History   Socioeconomic History  . Marital status: Single    Spouse name: Not on file  . Number of children: Not on file  . Years of education: Not on file  . Highest education level: Not on file  Social Needs  . Financial resource strain: Not on file  . Food insecurity - worry: Not on file  . Food insecurity - inability: Not on file  . Transportation needs - medical: Not on file  . Transportation needs - non-medical: Not on file  Occupational History  . Not on file  Tobacco Use  . Smoking status: Never Smoker  . Smokeless tobacco: Never Used  Substance and Sexual Activity  . Alcohol use: Yes    Comment: occasional  . Drug use: No  . Sexual activity: Not on file  Other Topics Concern  . Not on file  Social History Narrative  . Not on file    FAMILY HISTORY: Family History  Problem Relation Age of Onset  . Heart attack Mother   . Breast cancer Mother 37  . Multiple sclerosis Sister   . Multiple sclerosis Sister     ALLERGIES:  has No Known Allergies.  MEDICATIONS:  Current Outpatient Medications  Medication Sig Dispense Refill  . aspirin 81 MG chewable tablet Chew 81 mg by mouth daily.    . benzonatate (TESSALON) 100 MG capsule Take 1 capsule (100 mg total) by mouth every 8 (eight) hours. 21 capsule 0  . hydrochlorothiazide (HYDRODIURIL) 25 MG tablet Take 25 mg by mouth daily.    Marland Kitchen HYDROcodone-acetaminophen (NORCO/VICODIN) 5-325 MG tablet   0  . ibuprofen (ADVIL,MOTRIN) 600 MG tablet Take 1 tablet (600 mg total) by mouth every 6 (six) hours as needed. 30 tablet 0  . lisinopril (PRINIVIL,ZESTRIL) 10 MG tablet Take 10 mg by mouth daily.    Marland Kitchen ezetimibe (ZETIA) 10 MG tablet   4  . olopatadine (PATANOL) 0.1 % ophthalmic solution   3   No current facility-administered medications for this  visit.     REVIEW OF SYSTEMS:   Constitutional: Denies fevers, chills or abnormal night sweats Eyes: Denies blurriness of vision, double vision or watery eyes Ears, nose, mouth, throat, and face: Denies mucositis or sore throat Respiratory: Denies cough, dyspnea or wheezes Cardiovascular: Denies palpitation, chest discomfort or lower extremity swelling Gastrointestinal:  Denies nausea, heartburn or change in bowel habits Skin: Denies abnormal skin rashes Lymphatics: Denies new lymphadenopathy or easy bruising Neurological:Denies numbness, tingling or new weaknesses Behavioral/Psych: Mood is stable, no new changes  MSK: (+) severe joint pain  All other systems were reviewed with the patient and are negative.  PHYSICAL EXAMINATION: ECOG PERFORMANCE STATUS: 0 - Asymptomatic  Vitals:   01/26/18 1421  BP: 127/78  Pulse: 73  Resp: 18  Temp: 98.7 F (37.1 C)  SpO2: 99%   Filed Weights   01/26/18 1421  Weight: 138 lb 3.2 oz (62.7 kg)    GENERAL:alert, no distress and comfortable SKIN: skin color, texture, turgor are normal, no rashes or significant lesions EYES: normal, conjunctiva are pink  and non-injected, sclera clear OROPHARYNX:no exudate, no erythema and lips, buccal mucosa, and tongue normal  NECK: supple, thyroid normal size, non-tender, without nodularity LYMPH:  no palpable lymphadenopathy in the cervical, axillary or inguinal LUNGS: clear to auscultation and percussion with normal breathing effort HEART: regular rate & rhythm and no murmurs and no lower extremity edema ABDOMEN:abdomen soft, non-tender and normal bowel sounds Musculoskeletal:no cyanosis of digits and no clubbing  PSYCH: alert & oriented x 3 with fluent speech NEURO: no focal motor/sensory deficits Breast: Breasts: Breast inspection showed them to be symmetrical with no nipple discharge. Small nodule at the upper outer corner of left breast due to biopsy. Otherwise, palpation of the breasts and axilla  revealed no other obvious mass that I could appreciate.   LABORATORY DATA:  I have reviewed the data as listed CBC Latest Ref Rng & Units 03/10/2009 11/10/2008 01/03/2008  WBC 4.5 - 10.5 10*3/microliter 3.7(L) 3.5(L) 3.9  Hemoglobin 12.0 - 15.0 g/dL 11.1(L) 10.9(L) 11.4(L)  Hematocrit 36.0 - 46.0 % 33.1(L) 32.7(L) 33.5(L)  Platelets 150.0 - 400.0 K/uL 184.0 180 193    CMP Latest Ref Rng & Units 11/10/2008 10/02/2007 09/04/2007  Glucose 70 - 99 mg/dL 82 83 83  BUN 6 - 23 mg/dL 12 7 13   Creatinine 0.4 - 1.2 mg/dL 0.6 0.7 0.69  Sodium 135 - 145 meq/L 144 141 140  Potassium 3.5 - 5.1 meq/L 4.0 3.9 3.9  Chloride 96 - 112 meq/L 111 108 105  CO2 19 - 32 meq/L 29 28 26   Calcium 8.4 - 10.5 mg/dL 8.7 8.5 9.0  Total Protein 6.0 - 8.3 g/dL 6.5 6.2 -  Total Bilirubin 0.3 - 1.2 mg/dL 0.7 0.4 -  Alkaline Phos 39 - 117 units/L 34(L) 31(L) -  AST 0 - 37 units/L 16 18 -  ALT 0 - 35 units/L 11 21 -   PATHOLOGY Diagnosis 01/04/18 Breast, left, needle core biopsy, upper outer quadrant at posterior depth - DUCTAL CARCINOMA IN SITU WITH CALCIFICATIONS, SEE COMMENT. Microscopic Comment The carcinoma appears intermediate grade. Prognostic markers will be ordered. The case was called to [facility] on 01/05/2018. Results: Estrogen Receptor: 95%, POSITIVE, MODERATE STAINING INTENSITY Progesterone Receptor: 2%, POSITIVE, WEAK STAINING INTENSITY   Diagnosis 01/01/18 Breast, left, needle core biopsy, UOQ - DUCTAL CARCINOMA IN SITU WITH CALCIFICATIONS. - SEE COMMENT. Microscopic Comment The carcinoma appears intermediate grade. Estrogen receptor and progesterone receptor studies will be performed and the results reported separately. The results were called to the [facility] on 01/02/2018.  Results: Estrogen Receptor: 100%, POSITIVE, STRONG STAINING INTENSITY Progesterone Receptor: 30%, POSITIVE, MODERATE STAINING INTENSITY   RADIOGRAPHIC STUDIES: I have personally  reviewed the radiological images as listed and agreed with the findings in the report. Dg Chest 2 View  Result Date: 01/14/2018 CLINICAL DATA:  Shortness of breath today.  Cough.  Previous smoker. EXAM: CHEST  2 VIEW COMPARISON:  09/26/2012 FINDINGS: The heart size and mediastinal contours are within normal limits. Both lungs are clear. The visualized skeletal structures are unremarkable. IMPRESSION: No active cardiopulmonary disease. Electronically Signed   By: Lucienne Capers M.D.   On: 01/14/2018 01:11   Mm Clip Placement Left  Result Date: 01/04/2018 CLINICAL DATA:  Confirmation clip placement after stereotactic core needle biopsy of the posterior extent of a group of calcifications in the upper outer quadrant of the left breast, biopsy-proven intermediate grade DCIS based on prior stereotactic biopsy of the anterior extent of the calcifications. EXAM: DIAGNOSTIC LEFT  MAMMOGRAM POST STEREOTACTIC BIOPSY COMPARISON:  Previous exam(s). FINDINGS: Mammographic images were obtained following stereotactic tomosynthesis guided biopsy of the posterior extent of a group of calcifications in the upper outer quadrant of the left breast. The X shaped tissue marker clip is appropriately positioned at the site of the biopsied calcifications in the upper outer quadrant at posterior depth. Expected post biopsy changes are present without evidence of hematoma. Calcifications are present approximately 8 mm posterior to the X shaped tissue marker clip. I do not identify calcifications anterior to the previously placed coil shaped tissue marker clip. There are calcifications which are approximately 8 mm medial to the coil shaped tissue marker clip. The clips are approximately 6.0 cm apart in the upper outer quadrant. There are calcifications which are approximately 1.5 cm deep to the coil shaped clip anteriorly and approximately 1.7 cm deep to the X shaped tissue marker clip posteriorly. IMPRESSION: 1. Appropriate positioning  of the X shaped tissue marker clip at the site of the biopsied calcifications in the upper outer quadrant of the left breast at posterior depth. 2. The group of calcifications involving the upper outer quadrant extend approximately 8 mm posterior to the X shaped tissue marker clip. 3. The X shaped tissue marker clip and the previously placed coil shaped tissue marker clip are approximately 6 cm apart. 4. There are no calcifications anterior to the coil shaped tissue marker clip. There are calcifications approximately 8 mm medial to the coil shaped tissue marker clip. 5. Calcifications are present approximately 1.5 cm deep to both the X shaped clip posteriorly and the coil shaped clip anteriorly. Final Assessment: Post Procedure Mammograms for Marker Placement Electronically Signed   By: Evangeline Dakin M.D.   On: 01/04/2018 08:21   Mm Clip Placement Left  Result Date: 01/01/2018 CLINICAL DATA:  Status post stereotactic guided core needle biopsy of a 6.8 cm group of calcifications in the upper-outer quadrant of the left breast EXAM: DIAGNOSTIC LEFT MAMMOGRAM POST STEREOTACTIC BIOPSY COMPARISON:  Previous exam(s). FINDINGS: Mammographic images were obtained following stereotactic guided biopsy of the recently demonstrated 6.8 cm group of calcifications in the upper-outer quadrant of the left breast. These demonstrate a coil shaped biopsy marker clip at the expected location of the biopsied calcifications. IMPRESSION: Appropriate clip deployment following left breast stereotactic guided core needle biopsy. Final Assessment: Post Procedure Mammograms for Marker Placement Electronically Signed   By: Claudie Revering M.D.   On: 01/01/2018 13:36   Mm Lt Breast Bx W Loc Dev 1st Lesion Image Bx Spec Stereo Guide  Addendum Date: 01/05/2018   ADDENDUM REPORT: 01/05/2018 14:25 ADDENDUM: Pathology revealed INTERMEDIATE GRADE DUCTAL CARCINOMA IN SITU WITH CALCIFICATIONS of the Left breast, upper outer quadrant at posterior  depth. This was found to be concordant by Dr. Peggye Fothergill. Pathology results were discussed with the patient by telephone. The patient reported doing well after the biopsy with tenderness at the site. Post biopsy instructions and care were reviewed and questions were answered. The patient was encouraged to call [facility] for any additional concerns. The patient has a recent diagnosis of left breast cancer and should follow her outlined treatment plan. Pathology results were called to Dr. Erroll Luna on January 05, 2018 at 9:10 AM. Pathology results reported by Terie Purser, RN on 01/05/2018. Electronically Signed   By: Evangeline Dakin M.D.   On: 01/05/2018 14:25   Result Date: 01/05/2018 CLINICAL DATA:  52 year old with biopsy-proven intermediate grade DCIS involving the upper  outer quadrant of the left breast. The suspicious segmental group of calcifications spans at least 7 cm. She returns today for biopsy of the posterior extent of this group of calcifications. EXAM: LEFT BREAST STEREOTACTIC CORE NEEDLE BIOPSY COMPARISON:  Previous exams. FINDINGS: The patient and I discussed the procedure of stereotactic-guided biopsy including benefits and alternatives. We discussed the high likelihood of a successful procedure. We discussed the risks of the procedure including infection, bleeding, tissue injury, clip migration, and inadequate sampling. Informed written consent was given. The usual time out protocol was performed immediately prior to the procedure. Using sterile technique with chlorhexidine as skin antisepsis, 1% lidocaine and 1% lidocaine local anesthetic as local anesthetic, under stereotactic guidance, a 9 gauge Brevera vacuum assisted device was used to perform core needle biopsy of the posterior extent of the group of calcifications involving the upper outer quadrant of the left breast using a superior approach. Specimen radiograph was performed showing calcifications  in at least 3 of the core samples. Specimens with calcifications are identified for pathology. Lesion quadrant: Upper outer quadrant. At the conclusion of the procedure, an X shaped tissue marker clip was deployed into the biopsy cavity. Follow-up 2-view mammogram was performed and dictated separately. IMPRESSION: Stereotactic-guided biopsy of the posterior extent of a segmental group of calcifications in the upper outer quadrant of the left breast. No apparent complications. (Previous biopsy of the anterior extent of the calcifications demonstrated intermediate grade DCIS). Electronically Signed: By: Evangeline Dakin M.D. On: 01/04/2018 08:23   Mm Lt Breast Bx W Loc Dev 1st Lesion Image Bx Spec Stereo Guide  Addendum Date: 01/02/2018   ADDENDUM REPORT: 01/02/2018 14:22 ADDENDUM: Pathology revealed INTERMEDIATE GRADE DUCTAL CARCINOMA IN SITU WITH CALCIFICATIONS of the Left breast, upper outer quadrant. This was found to be concordant by Dr. Claudie Revering. Pathology results were discussed with the patient by telephone. The patient reported doing well after the biopsy with tenderness and minimal bleeding at the site. Post biopsy instructions and care were reviewed and questions were answered. The patient was encouraged to call [facility] for any additional concerns. Surgical consultation has been arranged with Dr. Erroll Luna at [facility] on January 05, 2018. The patient is scheduled for a Left breast stereotatic biopsy on January 04, 2018 at [facility] to biopsy the posterior extent of the calcifications. Pathology results reported by Terie Purser, RN on 01/02/2018. Electronically Signed   By: Claudie Revering M.D.   On: 01/02/2018 14:22   Result Date: 01/02/2018 CLINICAL DATA:  6.8 cm linearly group of indeterminate calcifications in the upper outer left breast at recent mammography. EXAM: LEFT BREAST STEREOTACTIC CORE NEEDLE BIOPSY COMPARISON:  Previous exams.  FINDINGS: The patient and I discussed the procedure of stereotactic-guided biopsy including benefits and alternatives. We discussed the high likelihood of a successful procedure. We discussed the risks of the procedure including infection, bleeding, tissue injury, clip migration, and inadequate sampling. Informed written consent was given. The usual time out protocol was performed immediately prior to the procedure. Using sterile technique and 1% Lidocaine as local anesthetic, under stereotactic guidance, a 9 gauge vacuum assisted device was used to perform core needle biopsy of the anterior portion of the recently demonstrated 6.8 cm group calcifications in the upper-outer quadrant of the left breast using a lateral approach. Specimen radiograph was performed showing multiple calcifications. Specimen(s) with calcifications are identified for pathology. Lesion quadrant: Upper outer quadrant At the conclusion of the procedure, a coil  shaped tissue marker clip was deployed into the biopsy cavity. Follow-up 2-view mammogram was performed and dictated separately. IMPRESSION: Stereotactic-guided biopsy of the recently demonstrated 6.8 cm group of calcifications in the upper-outer quadrant of the left breast. No apparent complications. Electronically Signed: By: Claudie Revering M.D. On: 01/01/2018 13:27   Diagnostic Mammogram 12/27/17 IMPRESSION: Indeterminate calcifications in the upper-outer quadrant of the left breast.  Screening Mammogram 12/15/17 IMPRESSION: Further evaluation is suggested for calcifications in the left breast.  ASSESSMENT & PLAN:  Deanna Roberts is 52 year-old post-menopausal woman, presented with screening discovered to DCIS.  1. Left breast DCIS, grade 2, ER+ /PR + -I discussed her breast imaging and needle biopsy results with patient in great detail. -She is a candidate for breast surgery. There area of calcification is about biopsied is 6.8 cm, which may make a lumpectomy challenging.  She has seen breast surgeon Dr. Brantley Stage, lumpectomy versus mastectomy and reconstruction were discussed with patient in details.  She has not decided about surgery.  Seems to be a little reluctant about surgery, especially mastectomy, due to her work schedule. -Discussed that the surgery is the standard care for DCIS for now. -We discussed that her DCIS will be cured with surgery, and any adjuvant therapy is to prevent future breast cancer. -We discussed that DCIS is a high risk for future breast cancer, adjuvant breast radiation (if she has lumpectomy) chemoprevention with antiestrogen therapy could be considered to reduce her risk of future breast cancer. -Given her strongly positive ER and PR, she is a candidate for adjuvant antiestrogen therapy with Tamoxifen or Anastrozole, which decrease her risk of future breast cancer by ~40%.  -She will likely benefit from breast radiation if she undergo lumpectomy to decrease the risk of breast cancer. If she chooses mastectomy, she will not need radition -We also discussed that biopsy may have sampling limitation, we will review her surgical path, to see if she has any invasive carcinoma components. -We discussed breast cancer surveillance after she completes treatment, Including annual mammogram, breast exam every 6-12 months. --I also discussed the option of participating our clinical trial [employer] study. It is a phase 3 trial for low or intermediate grade DCIS patients and it randomizes surgery vs observation with mammogram and breast exams. I discussed that she may be candidate for this and more information about the study. She is interested in the met our [place] after her meeting with me. -She will be seen by the breast navigator Varney Biles today  -She is scheduled to see her doctor next week -future appointment is open depending on her decision about surgery and clinical trial   2. Genetic testing -Given her strong family history of breast  cancer, I recommend her to undergo genetic testing to ruled out inheritable breast cancer -Her mother had breast cancer in her 24s. She does not have children, she has 1 living sister -She is agreeable, will refer   Plan -f/u appointment is open depending on her decision about surgery and participating of COMET clinical trial  -genetic referral    No orders of the defined types were placed in this encounter.   All questions were answered. The patient knows to call the clinic with any problems, questions or concerns. I spent 40 minutes counseling the patient face to face. The total time spent in the appointment was 50 minutes and more than 50% was on counseling.  This document serves as a record of services personally performed by Truitt Merle, MD. It  was created on her behalf by Theresia Bough, a trained medical scribe. The creation of this record is based on the scribe's personal observations and the provider's statements to them.   I have reviewed the above documentation for accuracy and completeness, and I agree with the above.      Truitt Merle, MD 01/26/2018

## 2018-01-26 ENCOUNTER — Encounter: Payer: Self-pay | Admitting: *Deleted

## 2018-01-26 ENCOUNTER — Inpatient Hospital Stay: Payer: BLUE CROSS/BLUE SHIELD | Admitting: Hematology

## 2018-01-26 ENCOUNTER — Encounter: Payer: Self-pay | Admitting: Hematology

## 2018-01-26 DIAGNOSIS — D0512 Intraductal carcinoma in situ of left breast: Secondary | ICD-10-CM | POA: Insufficient documentation

## 2018-01-26 DIAGNOSIS — Z7982 Long term (current) use of aspirin: Secondary | ICD-10-CM | POA: Insufficient documentation

## 2018-01-26 DIAGNOSIS — I252 Old myocardial infarction: Secondary | ICD-10-CM | POA: Diagnosis not present

## 2018-01-26 DIAGNOSIS — E119 Type 2 diabetes mellitus without complications: Secondary | ICD-10-CM | POA: Insufficient documentation

## 2018-01-26 DIAGNOSIS — Z90710 Acquired absence of both cervix and uterus: Secondary | ICD-10-CM

## 2018-01-26 DIAGNOSIS — Z79899 Other long term (current) drug therapy: Secondary | ICD-10-CM | POA: Diagnosis not present

## 2018-01-26 DIAGNOSIS — Z803 Family history of malignant neoplasm of breast: Secondary | ICD-10-CM | POA: Diagnosis not present

## 2018-01-26 DIAGNOSIS — Z170 Estrogen receptor positive status [ER+]: Secondary | ICD-10-CM | POA: Insufficient documentation

## 2018-01-26 DIAGNOSIS — I10 Essential (primary) hypertension: Secondary | ICD-10-CM | POA: Insufficient documentation

## 2018-01-29 ENCOUNTER — Encounter: Payer: Self-pay | Admitting: *Deleted

## 2018-01-30 ENCOUNTER — Ambulatory Visit
Admission: RE | Admit: 2018-01-30 | Discharge: 2018-01-30 | Disposition: A | Discharge status: Home / Self Care | Payer: BLUE CROSS/BLUE SHIELD | Source: Ambulatory Visit | Attending: Radiation Oncology | Admitting: Radiation Oncology

## 2018-01-30 ENCOUNTER — Encounter: Payer: Self-pay | Admitting: *Deleted

## 2018-01-30 ENCOUNTER — Inpatient Hospital Stay: Payer: BLUE CROSS/BLUE SHIELD | Admitting: *Deleted

## 2018-01-30 ENCOUNTER — Encounter: Payer: Self-pay | Admitting: Radiation Oncology

## 2018-01-30 VITALS — BP 120/72 | HR 67 | Temp 98.1°F | Ht 67.0 in | Wt 139.0 lb

## 2018-01-30 DIAGNOSIS — D0512 Intraductal carcinoma in situ of left breast: Secondary | ICD-10-CM | POA: Diagnosis present

## 2018-01-30 DIAGNOSIS — E119 Type 2 diabetes mellitus without complications: Secondary | ICD-10-CM | POA: Diagnosis not present

## 2018-01-30 DIAGNOSIS — I252 Old myocardial infarction: Secondary | ICD-10-CM | POA: Diagnosis not present

## 2018-01-30 DIAGNOSIS — I2510 Atherosclerotic heart disease of native coronary artery without angina pectoris: Secondary | ICD-10-CM | POA: Insufficient documentation

## 2018-01-30 DIAGNOSIS — Z170 Estrogen receptor positive status [ER+]: Secondary | ICD-10-CM

## 2018-01-30 DIAGNOSIS — C50412 Malignant neoplasm of upper-outer quadrant of left female breast: Secondary | ICD-10-CM

## 2018-01-30 DIAGNOSIS — Z87891 Personal history of nicotine dependence: Secondary | ICD-10-CM | POA: Diagnosis not present

## 2018-01-30 DIAGNOSIS — Z803 Family history of malignant neoplasm of breast: Secondary | ICD-10-CM | POA: Diagnosis not present

## 2018-01-30 DIAGNOSIS — R0602 Shortness of breath: Secondary | ICD-10-CM | POA: Insufficient documentation

## 2018-01-30 DIAGNOSIS — R05 Cough: Secondary | ICD-10-CM | POA: Diagnosis not present

## 2018-01-30 DIAGNOSIS — Z7982 Long term (current) use of aspirin: Secondary | ICD-10-CM | POA: Diagnosis not present

## 2018-01-30 DIAGNOSIS — I10 Essential (primary) hypertension: Secondary | ICD-10-CM | POA: Insufficient documentation

## 2018-01-30 DIAGNOSIS — Z90710 Acquired absence of both cervix and uterus: Secondary | ICD-10-CM | POA: Diagnosis not present

## 2018-01-30 DIAGNOSIS — Z79899 Other long term (current) drug therapy: Secondary | ICD-10-CM | POA: Diagnosis not present

## 2018-01-30 NOTE — Progress Notes (Signed)
Radiation Oncology         [phone] ________________________________  Initial outpatient Consultation  Name: Deanna Roberts MRN: [id number]  Date: 01/30/2018  DOB: 1966-12-02  HE:NIDPOEU, No Pcp Per  Erroll Luna, MD   REFERRING PHYSICIAN: Erroll Luna, MD  DIAGNOSIS:    ICD-10-CM   1. Ductal carcinoma in situ (DCIS) of left breast D05.12   2. Carcinoma of upper-outer quadrant of left breast in female, estrogen receptor positive ([place]) C50.412    Z17.0    Stage 0 [id number] Left Breast UOQ Ductal Carcinoma In Situ with calcifications, ER 95-100% / PR 2-30%, Intermediate grade  CHIEF COMPLAINT: Here to discuss management of her left breast DCIS  HISTORY OF PRESENT ILLNESS::Deanna Roberts is a 52 y.o. female who was found to have indeterminate calcifications spanning 6.8cm in the UOQ of the left breast on screening mammogram. Patient underwent biopsy of left breast UOQ on 01/01/18 revealing DCIS with calcification at UOQ, ER 100%, PR 30%. Repeat biopsy on OUQ at posterior depth was performed on 01/04/18 again showing DCIS with calcifications, but with ER 95%, PR 2%.  The patient has met with Dr. Burr Medico. She also met with Dr. Iran Planas in plastic surgery concerning potential reconstruction. She has been kindly referred to radiation oncology to discuss the role of radiation therapy in the treatment of her breast cancer.  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 She denies having had any symptoms prior to mammogram. Patient endorses no other symptoms aside from joint pain. She endorses rare ETOH use. Denies hx of chest pains, SOB, digestive/urinary troubles, edema, rashes, anxiety or depression. She endorses muscle weakness issues while on pravastatin but this has ceased once discontinuing statin. She endorses hysterectomy two years ago.  She is a Education officer, museum.  PREVIOUS RADIATION THERAPY: No  PAST MEDICAL HISTORY:  has a past medical  history of Coronary artery disease, Diabetes mellitus without complication ([place]), Hypertension, and Myocardial infarction ([place]) (2013).    PAST SURGICAL HISTORY: Past Surgical History:  Procedure Laterality Date  . ABDOMINAL HYSTERECTOMY      FAMILY HISTORY: family history includes Breast cancer (age of onset: 42) in her mother; Heart attack in her mother; Multiple sclerosis in her sister and sister.  SOCIAL HISTORY:  reports that  has never smoked. she has never used smokeless tobacco. She reports that she drinks alcohol. She reports that she does not use drugs.  ALLERGIES: Patient has no known allergies.  MEDICATIONS:  Current Outpatient Medications  Medication Sig Dispense Refill  . aspirin 81 MG chewable tablet Chew 81 mg by mouth daily.    Marland Kitchen ezetimibe (ZETIA) 10 MG tablet   4  . hydrochlorothiazide (HYDRODIURIL) 25 MG tablet Take 25 mg by mouth daily.    Marland Kitchen HYDROcodone-acetaminophen (NORCO/VICODIN) 5-325 MG tablet   0  . ibuprofen (ADVIL,MOTRIN) 600 MG tablet Take 1 tablet (600 mg total) by mouth every 6 (six) hours as needed. 30 tablet 0  . lisinopril (PRINIVIL,ZESTRIL) 10 MG tablet Take 10 mg by mouth daily.    Marland Kitchen olopatadine (PATANOL) 0.1 % ophthalmic solution   3  . benzonatate (TESSALON) 100 MG capsule Take 1 capsule (100 mg total) by mouth every 8 (eight) hours. (Patient not taking: Reported on 01/30/2018) 21 capsule 0   No current facility-administered medications for this encounter.     REVIEW OF SYSTEMS: A 10+ POINT REVIEW OF SYSTEMS WAS OBTAINED including neurology, dermatology, psychiatry, cardiac, respiratory, lymph, extremities, GI, GU, Musculoskeletal, constitutional, breasts,  reproductive, HEENT.  All pertinent positives are noted in the HPI.  All others are negative.   PHYSICAL EXAM:  height is 5' 7"  (1.702 m) and weight is 139 lb (63 kg). Her temperature is 98.1 F (36.7 C). Her blood pressure is 120/72 and her pulse is 67. Her oxygen saturation is 100%.   General:  Alert and oriented, in no acute distress HEENT: Head is normocephalic. Extraocular movements are intact. Oropharynx is clear. Neck: Neck is supple, no palpable cervical or supraclavicular lymphadenopathy. Heart: Regular in rate and rhythm with no murmurs, rubs, or gallops. Chest: Clear to auscultation bilaterally, with no rhonchi, wheezes, or rales. Abdomen: Soft, nontender, nondistended, with no rigidity or guarding. Extremities: No cyanosis or edema. Lymphatics: see Neck Exam Skin: No concerning lesions. Musculoskeletal: symmetric strength and muscle tone throughout. Neurologic: Cranial nerves II through XII are grossly intact. No obvious focalities. Speech is fluent. Coordination is intact. Psychiatric: Judgment and insight are intact. Affect is appropriate. Breasts: She has some superficial subcutaneous thickening of the L breast in the OUQ, likely an artifact of recent biopsy. No other palpable masses appreciated in the breasts or axillae.    ECOG = 0  0 - Asymptomatic (Fully active, able to carry on all predisease activities without restriction)  1 - Symptomatic but completely ambulatory (Restricted in physically strenuous activity but ambulatory and able to carry out work of a light or sedentary nature. For example, light housework, office work)  2 - Symptomatic, <50% in bed during the day (Ambulatory and capable of all self care but unable to carry out any work activities. Up and about more than 50% of waking hours)  3 - Symptomatic, >50% in bed, but not bedbound (Capable of only limited self-care, confined to bed or chair 50% or more of waking hours)  4 - Bedbound (Completely disabled. Cannot carry on any self-care. Totally confined to bed or chair)  5 - Death   Eustace Pen MM, Creech RH, Tormey DC, et al. [phone]). "Toxicity and response criteria of the [facility]". [place] Oncol. 5 (6): 649-55   LABORATORY DATA:  Lab Results  Component Value Date    WBC 3.7 (L) 03/10/2009   HGB 11.1 (L) 03/10/2009   HCT 33.1 (L) 03/10/2009   MCV 90.1 03/10/2009   PLT 184.0 03/10/2009   CMP     Component Value Date/Time   NA 144 11/10/2008 0940   K 4.0 11/10/2008 0940   CL 111 11/10/2008 0940   CO2 29 11/10/2008 0940   GLUCOSE 82 11/10/2008 0940   GLUCOSE 84 09/21/2006 0721   BUN 12 11/10/2008 0940   CREATININE 0.6 11/10/2008 0940   CALCIUM 8.7 11/10/2008 0940   PROT 6.5 11/10/2008 0940   ALBUMIN 3.7 11/10/2008 0940   AST 16 11/10/2008 0940   ALT 11 11/10/2008 0940   ALKPHOS 34 (L) 11/10/2008 0940   BILITOT 0.7 11/10/2008 0940   GFRNONAA 116 11/10/2008 0940   GFRAA 140 11/10/2008 0940         RADIOGRAPHY: Dg Chest 2 View  Result Date: 01/14/2018 CLINICAL DATA:  Shortness of breath today.  Cough.  Previous smoker. EXAM: CHEST  2 VIEW COMPARISON:  09/26/2012 FINDINGS: The heart size and mediastinal contours are within normal limits. Both lungs are clear. The visualized skeletal structures are unremarkable. IMPRESSION: No active cardiopulmonary disease. Electronically Signed   By: Lucienne Capers M.D.   On: 01/14/2018 01:11   Mm Clip Placement Left  Result Date: 01/04/2018 CLINICAL  DATA:  Confirmation clip placement after stereotactic core needle biopsy of the posterior extent of a group of calcifications in the upper outer quadrant of the left breast, biopsy-proven intermediate grade DCIS based on prior stereotactic biopsy of the anterior extent of the calcifications. EXAM: DIAGNOSTIC LEFT MAMMOGRAM POST STEREOTACTIC BIOPSY COMPARISON:  Previous exam(s). FINDINGS: Mammographic images were obtained following stereotactic tomosynthesis guided biopsy of the posterior extent of a group of calcifications in the upper outer quadrant of the left breast. The X shaped tissue marker clip is appropriately positioned at the site of the biopsied calcifications in the upper outer quadrant at posterior depth. Expected post biopsy changes are present without  evidence of hematoma. Calcifications are present approximately 8 mm posterior to the X shaped tissue marker clip. I do not identify calcifications anterior to the previously placed coil shaped tissue marker clip. There are calcifications which are approximately 8 mm medial to the coil shaped tissue marker clip. The clips are approximately 6.0 cm apart in the upper outer quadrant. There are calcifications which are approximately 1.5 cm deep to the coil shaped clip anteriorly and approximately 1.7 cm deep to the X shaped tissue marker clip posteriorly. IMPRESSION: 1. Appropriate positioning of the X shaped tissue marker clip at the site of the biopsied calcifications in the upper outer quadrant of the left breast at posterior depth. 2. The group of calcifications involving the upper outer quadrant extend approximately 8 mm posterior to the X shaped tissue marker clip. 3. The X shaped tissue marker clip and the previously placed coil shaped tissue marker clip are approximately 6 cm apart. 4. There are no calcifications anterior to the coil shaped tissue marker clip. There are calcifications approximately 8 mm medial to the coil shaped tissue marker clip. 5. Calcifications are present approximately 1.5 cm deep to both the X shaped clip posteriorly and the coil shaped clip anteriorly. Final Assessment: Post Procedure Mammograms for Marker Placement Electronically Signed   By: Evangeline Dakin M.D.   On: 01/04/2018 08:21   Mm Clip Placement Left  Result Date: 01/01/2018 CLINICAL DATA:  Status post stereotactic guided core needle biopsy of a 6.8 cm group of calcifications in the upper-outer quadrant of the left breast EXAM: DIAGNOSTIC LEFT MAMMOGRAM POST STEREOTACTIC BIOPSY COMPARISON:  Previous exam(s). FINDINGS: Mammographic images were obtained following stereotactic guided biopsy of the recently demonstrated 6.8 cm group of calcifications in the upper-outer quadrant of the left breast. These demonstrate a coil  shaped biopsy marker clip at the expected location of the biopsied calcifications. IMPRESSION: Appropriate clip deployment following left breast stereotactic guided core needle biopsy. Final Assessment: Post Procedure Mammograms for Marker Placement Electronically Signed   By: Claudie Revering M.D.   On: 01/01/2018 13:36   Mm Lt Breast Bx W Loc Dev 1st Lesion Image Bx Spec Stereo Guide  Addendum Date: 01/05/2018   ADDENDUM REPORT: 01/05/2018 14:25 ADDENDUM: Pathology revealed INTERMEDIATE GRADE DUCTAL CARCINOMA IN SITU WITH CALCIFICATIONS of the Left breast, upper outer quadrant at posterior depth. This was found to be concordant by Dr. Peggye Fothergill. Pathology results were discussed with the patient by telephone. The patient reported doing well after the biopsy with tenderness at the site. Post biopsy instructions and care were reviewed and questions were answered. The patient was encouraged to call [facility] for any additional concerns. The patient has a recent diagnosis of left breast cancer and should follow her outlined treatment plan. Pathology results were called to Dr. Erroll Luna  on January 05, 2018 at 9:10 AM. Pathology results reported by Terie Purser, RN on 01/05/2018. Electronically Signed   By: Evangeline Dakin M.D.   On: 01/05/2018 14:25   Result Date: 01/05/2018 CLINICAL DATA:  52 year old with biopsy-proven intermediate grade DCIS involving the upper outer quadrant of the left breast. The suspicious segmental group of calcifications spans at least 7 cm. She returns today for biopsy of the posterior extent of this group of calcifications. EXAM: LEFT BREAST STEREOTACTIC CORE NEEDLE BIOPSY COMPARISON:  Previous exams. FINDINGS: The patient and I discussed the procedure of stereotactic-guided biopsy including benefits and alternatives. We discussed the high likelihood of a successful procedure. We discussed the risks of the procedure including infection, bleeding,  tissue injury, clip migration, and inadequate sampling. Informed written consent was given. The usual time out protocol was performed immediately prior to the procedure. Using sterile technique with chlorhexidine as skin antisepsis, 1% lidocaine and 1% lidocaine local anesthetic as local anesthetic, under stereotactic guidance, a 9 gauge Bre[place]a vacuum assisted device was used to perform core needle biopsy of the posterior extent of the group of calcifications involving the upper outer quadrant of the left breast using a superior approach. Specimen radiograph was performed showing calcifications in at least 3 of the core samples. Specimens with calcifications are identified for pathology. Lesion quadrant: Upper outer quadrant. At the conclusion of the procedure, an X shaped tissue marker clip was deployed into the biopsy cavity. Follow-up 2-view mammogram was performed and dictated separately. IMPRESSION: Stereotactic-guided biopsy of the posterior extent of a segmental group of calcifications in the upper outer quadrant of the left breast. No apparent complications. (Previous biopsy of the anterior extent of the calcifications demonstrated intermediate grade DCIS). Electronically Signed: By: Evangeline Dakin M.D. On: 01/04/2018 08:23   Mm Lt Breast Bx W Loc Dev 1st Lesion Image Bx Spec Stereo Guide  Addendum Date: 01/02/2018   ADDENDUM REPORT: 01/02/2018 14:22 ADDENDUM: Pathology revealed INTERMEDIATE GRADE DUCTAL CARCINOMA IN SITU WITH CALCIFICATIONS of the Left breast, upper outer quadrant. This was found to be concordant by Dr. Claudie Revering. Pathology results were discussed with the patient by telephone. The patient reported doing well after the biopsy with tenderness and minimal bleeding at the site. Post biopsy instructions and care were reviewed and questions were answered. The patient was encouraged to call [facility] for any additional concerns. Surgical consultation has been  arranged with Dr. Erroll Luna at [facility] on January 05, 2018. The patient is scheduled for a Left breast stereotatic biopsy on January 04, 2018 at [facility] to biopsy the posterior extent of the calcifications. Pathology results reported by Terie Purser, RN on 01/02/2018. Electronically Signed   By: Claudie Revering M.D.   On: 01/02/2018 14:22   Result Date: 01/02/2018 CLINICAL DATA:  6.8 cm linearly group of indeterminate calcifications in the upper outer left breast at recent mammography. EXAM: LEFT BREAST STEREOTACTIC CORE NEEDLE BIOPSY COMPARISON:  Previous exams. FINDINGS: The patient and I discussed the procedure of stereotactic-guided biopsy including benefits and alternatives. We discussed the high likelihood of a successful procedure. We discussed the risks of the procedure including infection, bleeding, tissue injury, clip migration, and inadequate sampling. Informed written consent was given. The usual time out protocol was performed immediately prior to the procedure. Using sterile technique and 1% Lidocaine as local anesthetic, under stereotactic guidance, a 9 gauge vacuum assisted device was used to perform core needle biopsy of the anterior portion  of the recently demonstrated 6.8 cm group calcifications in the upper-outer quadrant of the left breast using a lateral approach. Specimen radiograph was performed showing multiple calcifications. Specimen(s) with calcifications are identified for pathology. Lesion quadrant: Upper outer quadrant At the conclusion of the procedure, a coil shaped tissue marker clip was deployed into the biopsy cavity. Follow-up 2-view mammogram was performed and dictated separately. IMPRESSION: Stereotactic-guided biopsy of the recently demonstrated 6.8 cm group of calcifications in the upper-outer quadrant of the left breast. No apparent complications. Electronically Signed: By: Claudie Revering M.D. On: 01/01/2018 13:27      IMPRESSION/PLAN: left  breast DCIS  She is still determining what type of surgery she would prefer.  She does not seem to be leaning towards the COMET trial although this was offered by Dr. Burr Medico. She is leaning towards a generous lumpectomy followed by radiation therapy. She has already discussed the option of recontruction with Dr. Iran Planas. She plans to discuss surgery a bit more w/ Dr Brantley Stage.  She understands that the need for radiation is very unlikely if she undergoes mastectomy.   The patient is leaning towards lumpectomy and radiation therapy. We discussed the risks, benefits, and side effects of radiotherapy in the setting of breast conservation and how it would reduce her risk of locoregional recurrence by 1/2.  We discussed that radiation would take approximately 4 weeks to complete and that I would give the patient a few weeks to heal following surgery before starting treatment planning. We spoke about acute effects including skin irritation and fatigue as well as much less common late effects including internal organ injury or irritation. We spoke about the latest technology that is used to minimize the risk of late effects for patients undergoing radiotherapy to the breast or chest wall. No guarantees of treatment were given. The patient is enthusiastic about proceeding with treatment if lumpectomy is performed. I look forward to participating in the patient's care as needed.  I will await her referral back to me for postoperative follow-up and eventual CT simulation/treatment planning as needed.    __________________________________________   Eppie Gibson, MD   This document serves as a record of services personally performed by Eppie Gibson, MD. It was created on his behalf by Linward Natal, a trained medical scribe. The creation of this record is based on the scribe's personal observations and the provider's statements to them. This document has been checked and approved by the attending provider.

## 2018-01-30 NOTE — Progress Notes (Signed)
[place] Work  Holiday representative received referral from Art therapist for financial concerns.  CSW contacted patient at home to offer support and assess for needs.  CSW left patient a message offering support and information on support services.  CSW encouraged patient to call back with questions or concerns.    Johnnye Lana, MSW, LCSW, OSW-C Clinical Social Worker [facility] [phone]

## 2018-02-09 ENCOUNTER — Ambulatory Visit: Payer: Self-pay | Admitting: Surgery

## 2018-02-09 DIAGNOSIS — D0512 Intraductal carcinoma in situ of left breast: Secondary | ICD-10-CM

## 2018-02-09 NOTE — H&P (Signed)
Deanna Roberts Documented: 01/05/2018 10:15 AM Location: [facility] Patient #: [id number] DOB: 1966/02/22 Single / Language: [name] / Race: Black or African American Female   History of Present Illness Marcello Moores A. Tanith Dagostino MD; 01/05/2018 10:37 AM) Patient words: Patient sent at the request of Dr. Remer Macho for screening detected left breast microcalcifications. The area measures 6 cm left breast upper outer quadrant. 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 Patient denies any history of breast pain, nipple discharge or breast mass. She has no family history of breast cancer.                Patient was called back from screening mammogram for left breast calcifications  EXAM: DIGITAL DIAGNOSTIC LEFT MAMMOGRAM WITH CAD  COMPARISON: Previous exam(s).  ACR Breast Density Category c: The breast tissue is heterogeneously dense, which may obscure small masses.  FINDINGS: Additional imaging of the left breast was performed. There are calcifications in the upper-outer quadrant of the left breast spanning an area of approximately 6.8 cm. Calcifications have developed from prior exams and are indeterminate. There is no associated mass.  Mammographic images were processed with CAD.  IMPRESSION: Indeterminate calcifications in the upper-outer quadrant of the left breast.  RECOMMENDATION: Stereotactic biopsy of the left breast calcifications is recommended. The stereotactic biopsy will be scheduled at the patient's convenience.  I have discussed the findings and recommendations with the patient. Results were also provided in writing at the conclusion of the visit. If applicable, a reminder letter will be sent to the patient regarding the next appointment.  BI-RADS CATEGORY 4: Suspicious.   Electronically Signed By: Lillia Mountain M.D. On: 12/27/2017 16:09        ADDITIONAL INFORMATION: PROGNOSTIC  INDICATORS Results: IMMUNOHISTOCHEMICAL AND MORPHOMETRIC ANALYSIS PERFORMED MANUALLY Estrogen Receptor: 100%, POSITIVE, STRONG STAINING INTENSITY Progesterone Receptor: 30%, POSITIVE, MODERATE STAINING INTENSITY REFERENCE RANGE ESTROGEN RECEPTOR NEGATIVE 0% POSITIVE =>1% REFERENCE RANGE PROGESTERONE RECEPTOR NEGATIVE 0% POSITIVE =>1% All controls stained appropriately [name] MD Pathologist, Electronic Signature   Diagnosis Breast, left, needle core biopsy, UOQ - DUCTAL CARCINOMA IN SITU WITH CALCIFICATIONS. - SEE COMMENT. Microscopic Comment The carcinoma appears intermediate grade. Estrogen receptor and progesterone receptor studies will be performed    Diagnosis Breast, left, needle core biopsy, upper outer quadrant at posterior depth - DUCTAL CARCINOMA IN SITU WITH CALCIFICATIONS, SEE COMMENT. Microscopic Comment The carcinoma appears intermediate grade. Prognostic markers will be ordered. The case was called to [facility] on 01/05/2018. Vicente Males MD Pathologist, Electronic Signature (Case signed 01/05/2018) Specimen.  The patient is a 52 year old female.   Past Surgical History (Tanisha A. Owens Shark, [place]; 01/05/2018 10:15 AM) Breast Biopsy  Left. Hysterectomy (not due to cancer) - Complete   Diagnostic Studies History (Tanisha A. Owens Shark, [place]; 01/05/2018 10:15 AM) Colonoscopy  1-5 years ago Mammogram  within last year Pap Smear  1-5 years ago  Allergies (Tanisha A. Owens Shark, [place]; 01/05/2018 10:16 AM) No Known Drug Allergies [01/05/2018]: Allergies Reconciled   Medication History (Tanisha A. Owens Shark, [place]; 01/05/2018 10:17 AM) Hydrocodone-Acetaminophen (5-325MG  Tablet, Oral) Active. Lisinopril (10MG  Tablet, Oral) Active. HydroCHLOROthiazide (25MG  Tablet, Oral) Active. Pravastatin Sodium (20MG  Tablet, Oral) Active. Ezetimibe (10MG  Tablet, Oral) Active. Medications Reconciled  Social History (Tanisha A. Owens Shark, [place]; 01/05/2018 10:15  AM) Alcohol use  Occasional alcohol use. Caffeine use  Coffee. No drug use  Tobacco use  Former smoker.  Family History (Tanisha A. Owens Shark, [place]; 01/05/2018 10:15 AM) Arthritis  Mother. Breast Cancer  Mother. Cancer  Mother. Diabetes Mellitus  Mother. Heart Disease  Mother, Sister. Hypertension  Mother, Sister. Kidney Disease  Mother. Prostate Cancer  Father. Seizure disorder  Mother.  Pregnancy / Birth History (Tanisha A. Owens Shark, [place]; 01/05/2018 10:15 AM) Age at menarche  83 years. Age of menopause  57-50 Gravida  1 Irregular periods  Maternal age  17-40 Para  0  Other Problems (Tanisha A. Owens Shark, [place]; 01/05/2018 10:15 AM) Arthritis  Breast Cancer  Congestive Heart Failure  Diabetes Mellitus  High blood pressure  Hypercholesterolemia  Lump In Breast  Myocardial infarction     Review of Systems (Tanisha A. Brown RMA; 01/05/2018 10:15 AM) General Not Present- Appetite Loss, Chills, Fatigue, Fever, Night Sweats, Weight Gain and Weight Loss. Skin Not Present- Change in Wart/Mole, Dryness, Hives, Jaundice, New Lesions, Non-Healing Wounds, Rash and Ulcer. HEENT Present- Hoarseness and Wears glasses/contact lenses. Not Present- Earache, Hearing Loss, Nose Bleed, Oral Ulcers, Ringing in the Ears, Seasonal Allergies, Sinus Pain, Sore Throat, Visual Disturbances and Yellow Eyes. Respiratory Not Present- Bloody sputum, Chronic Cough, Difficulty Breathing, Snoring and Wheezing. Breast Not Present- Breast Mass, Breast Pain, Nipple Discharge and Skin Changes. Cardiovascular Present- Leg Cramps. Not Present- Chest Pain, Difficulty Breathing Lying Down, Palpitations, Rapid Heart Rate, Shortness of Breath and Swelling of Extremities. Gastrointestinal Not Present- Abdominal Pain, Bloating, Bloody Stool, Change in Bowel Habits, Chronic diarrhea, Constipation, Difficulty Swallowing, Excessive gas, Gets full quickly at meals, Hemorrhoids, Indigestion, Nausea, Rectal Pain  and Vomiting. Female Genitourinary Not Present- Frequency, Nocturia, Painful Urination, Pelvic Pain and Urgency. Musculoskeletal Present- Joint Pain, Joint Stiffness, Muscle Pain and Muscle Weakness. Not Present- Back Pain and Swelling of Extremities. Neurological Present- Headaches. Not Present- Decreased Memory, Fainting, Numbness, Seizures, Tingling, Tremor, Trouble walking and Weakness. Psychiatric Not Present- Anxiety, Bipolar, Change in Sleep Pattern, Depression, Fearful and Frequent crying. Endocrine Present- Hot flashes. Not Present- Cold Intolerance, Excessive Hunger, Hair Changes, Heat Intolerance and New Diabetes. Hematology Not Present- Blood Thinners, Easy Bruising, Excessive bleeding, Gland problems, HIV and Persistent Infections.  Vitals (Tanisha A. Brown RMA; 01/05/2018 10:16 AM) 01/05/2018 10:15 AM Weight: 144.4 lb Height: 66in Body Surface Area: 1.74 m Body Mass Index: 23.31 kg/m  Temp.: 98.69F  Pulse: 78 (Regular)  BP: 126/84 (Sitting, Left Arm, Standard)       Physical Exam (Akul Leggette A. Laurens Matheny MD; 01/05/2018 10:38 AM) General Mental Status-Alert. General Appearance-Consistent with stated age. Hydration-Well hydrated. Voice-Normal.  Head and Neck Head-normocephalic, atraumatic with no lesions or palpable masses. Trachea-midline. Thyroid Gland Characteristics - normal size and consistency.  Eye Eyeball - Bilateral-Extraocular movements intact. Sclera/Conjunctiva - Bilateral-No scleral icterus.  Chest and Lung Exam Chest and lung exam reveals -quiet, even and easy respiratory effort with no use of accessory muscles and on auscultation, normal breath sounds, no adventitious sounds and normal vocal resonance. Inspection Chest Wall - Normal. Back - normal.  Breast Breast - Left-Symmetric, Non Tender, No Biopsy scars, no Dimpling, No Inflammation, No Lumpectomy scars, No Mastectomy scars, No Peau d' Orange. Breast -  Right-Symmetric, Non Tender, No Biopsy scars, no Dimpling, No Inflammation, No Lumpectomy scars, No Mastectomy scars, No Peau d' Orange. Breast Lump-No Palpable Breast Mass. Note: Mild bruising left breast.   Cardiovascular Cardiovascular examination reveals -normal heart sounds, regular rate and rhythm with no murmurs and normal pedal pulses bilaterally.  Abdomen Inspection Inspection of the abdomen reveals - No Hernias. Skin - Scar - no surgical scars. Palpation/Percussion Palpation and Percussion of the abdomen reveal - Soft, Non Tender, No Rebound  tenderness, No Rigidity (guarding) and No hepatosplenomegaly. Auscultation Auscultation of the abdomen reveals - Bowel sounds normal.  Neurologic Neurologic evaluation reveals -alert and oriented x 3 with no impairment of recent or remote memory. Mental Status-Normal.  Musculoskeletal Normal Exam - Left-Upper Extremity Strength Normal and Lower Extremity Strength Normal. Normal Exam - Right-Upper Extremity Strength Normal and Lower Extremity Strength Normal.  Lymphatic Head & Neck  General Head & Neck Lymphatics: Bilateral - Description - Normal. Axillary  General Axillary Region: Bilateral - Description - Normal. Tenderness - Non Tender. Femoral & Inguinal - Did not examine.    Assessment & Plan (Graycie Halley A. Jamas Jaquay MD; 01/05/2018 10:38 AM) BREAST NEOPLASM, TIS (DCIS), LEFT (D05.12) Impression: Patient undecided decide treatment options. Can consider lumpectomy BUT this would lose volume versus mastectomy and reconstruction. We'll refer to medical radiation oncology as well as plastic surgery since she is undecided about treatment options. Current Plans Pt Education - [facility] Breast Cancer Information Given - [facility] "Breast Journey" Package Pt Education - [facility] Mastectomy HCI Pt Education - [facility] Breast Biopsy HCI: discussed with patient and provided information.   Signed by Turner Daniels, MD (01/05/2018 10:39 AM)

## 2018-02-15 ENCOUNTER — Other Ambulatory Visit: Payer: Self-pay | Admitting: Surgery

## 2018-02-15 DIAGNOSIS — D0512 Intraductal carcinoma in situ of left breast: Secondary | ICD-10-CM

## 2018-02-20 ENCOUNTER — Encounter: Payer: Self-pay | Admitting: Radiation Oncology

## 2018-03-15 ENCOUNTER — Encounter (HOSPITAL_BASED_OUTPATIENT_CLINIC_OR_DEPARTMENT_OTHER): Payer: Self-pay | Admitting: *Deleted

## 2018-03-15 ENCOUNTER — Other Ambulatory Visit: Payer: Self-pay

## 2018-03-15 NOTE — Progress Notes (Signed)
Pt to come in for EKG BMET and pick up Ensure pre op drink. Notes from Cardiologist Dr. Marijo File (surg clearance for pt's previous surgery) reviewed with Dr. Marcell Barlow. Pt will not need any further work up if has had no other cardiac events and is able to live an active lifestyle.

## 2018-03-16 ENCOUNTER — Encounter (HOSPITAL_BASED_OUTPATIENT_CLINIC_OR_DEPARTMENT_OTHER)
Admission: RE | Admit: 2018-03-16 | Discharge: 2018-03-16 | Disposition: A | Discharge status: Home / Self Care | Payer: BLUE CROSS/BLUE SHIELD | Source: Ambulatory Visit | Attending: Surgery | Admitting: Surgery

## 2018-03-16 ENCOUNTER — Encounter: Payer: Self-pay | Admitting: Radiation Oncology

## 2018-03-16 ENCOUNTER — Other Ambulatory Visit: Payer: Self-pay

## 2018-03-16 DIAGNOSIS — Z01810 Encounter for preprocedural cardiovascular examination: Secondary | ICD-10-CM | POA: Diagnosis present

## 2018-03-16 DIAGNOSIS — R9431 Abnormal electrocardiogram [ECG] [EKG]: Secondary | ICD-10-CM | POA: Insufficient documentation

## 2018-03-16 DIAGNOSIS — Z01812 Encounter for preprocedural laboratory examination: Secondary | ICD-10-CM | POA: Diagnosis not present

## 2018-03-16 LAB — BASIC METABOLIC PANEL
ANION GAP: 10 (ref 5–15)
BUN: <5 mg/dL — ABNORMAL LOW (ref 6–20)
CALCIUM: 8.7 mg/dL — AB (ref 8.9–10.3)
CO2: 24 mmol/L (ref 22–32)
Chloride: 106 mmol/L (ref 101–111)
Creatinine, Ser: 0.66 mg/dL (ref 0.44–1.00)
GFR calc Af Amer: >60 mL/min (ref >60)
GFR calc non Af Amer: >60 mL/min (ref >60)
GLUCOSE: 78 mg/dL (ref 65–99)
POTASSIUM: 3.9 mmol/L (ref 3.5–5.1)
Sodium: 140 mmol/L (ref 135–145)

## 2018-03-16 NOTE — Progress Notes (Signed)
Rec'd from patient on the machine, FMLA paperwork. It will be sent to Debria Garret, RN, to be completed.

## 2018-03-16 NOTE — Progress Notes (Signed)
Pt in for PAT appt, Ensure given and instructions reviewed.

## 2018-03-19 NOTE — Progress Notes (Signed)
Lab results and EKG reviewed by Dr. Gifford Shave, will proceed with surgery as scheduled.

## 2018-03-21 ENCOUNTER — Other Ambulatory Visit: Payer: Self-pay | Admitting: Surgery

## 2018-03-21 ENCOUNTER — Ambulatory Visit
Admission: RE | Admit: 2018-03-21 | Discharge: 2018-03-21 | Disposition: A | Discharge status: Home / Self Care | Payer: BLUE CROSS/BLUE SHIELD | Source: Ambulatory Visit | Attending: Surgery | Admitting: Surgery

## 2018-03-21 ENCOUNTER — Encounter (HOSPITAL_BASED_OUTPATIENT_CLINIC_OR_DEPARTMENT_OTHER): Payer: Self-pay | Admitting: Anesthesiology

## 2018-03-21 DIAGNOSIS — D0512 Intraductal carcinoma in situ of left breast: Secondary | ICD-10-CM

## 2018-03-21 NOTE — Anesthesia Preprocedure Evaluation (Addendum)
Anesthesia Evaluation  Patient identified by MRN, date of birth, ID band Patient awake    Reviewed: Allergy & Precautions, NPO status , Patient's Chart, lab work & pertinent test results  Airway Mallampati: III  TM Distance: >3 FB Neck ROM: Full    Dental no notable dental hx.    Pulmonary neg pulmonary ROS,    Pulmonary exam normal breath sounds clear to auscultation       Cardiovascular hypertension, Pt. on medications + CAD and + Past MI  Normal cardiovascular exam Rhythm:Regular Rate:Normal  ECG: NSR, rate 65  Notes from Cardiologist Dr. Marijo File (surg clearance for pt's previous surgery)   Neuro/Psych negative neurological ROS  negative psych ROS   GI/Hepatic negative GI ROS, Neg liver ROS,   Endo/Other  diabetes  Renal/GU negative Renal ROS     Musculoskeletal negative musculoskeletal ROS (+)   Abdominal   Peds  Hematology negative hematology ROS (+)   Anesthesia Other Findings LEFT DCIS  Reproductive/Obstetrics                           Lab Results  Component Value Date   WBC 3.7 (L) 03/10/2009   HGB 11.1 (L) 03/10/2009   HCT 33.1 (L) 03/10/2009   MCV 90.1 03/10/2009   PLT 184.0 03/10/2009   Lab Results  Component Value Date   CREATININE 0.66 03/16/2018   BUN <5 (L) 03/16/2018   NA 140 03/16/2018   K 3.9 03/16/2018   CL 106 03/16/2018   CO2 24 03/16/2018   No results found for: INR, PROTIME  EKG: normal sinus rhythm.   Anesthesia Physical Anesthesia Plan  ASA: III  Anesthesia Plan: General   Post-op Pain Management:    Induction: Intravenous  PONV Risk Score and Plan: 4 or greater and Ondansetron, Dexamethasone and Midazolam  Airway Management Planned: LMA  Additional Equipment: None  Intra-op Plan:   Post-operative Plan: Extubation in OR  Informed Consent: I have reviewed the patients History and Physical, chart, labs and discussed the procedure  including the risks, benefits and alternatives for the proposed anesthesia with the patient or authorized representative who has indicated his/her understanding and acceptance.   Dental advisory given  Plan Discussed with: CRNA  Anesthesia Plan Comments:       Anesthesia Quick Evaluation

## 2018-03-22 ENCOUNTER — Ambulatory Visit (HOSPITAL_BASED_OUTPATIENT_CLINIC_OR_DEPARTMENT_OTHER)
Admission: RE | Admit: 2018-03-22 | Discharge: 2018-03-22 | Disposition: A | Discharge status: Home / Self Care | Payer: BLUE CROSS/BLUE SHIELD | Source: Ambulatory Visit | Attending: Surgery | Admitting: Surgery

## 2018-03-22 ENCOUNTER — Encounter (HOSPITAL_BASED_OUTPATIENT_CLINIC_OR_DEPARTMENT_OTHER)
Admission: RE | Disposition: A | Discharge status: Home / Self Care | Payer: Self-pay | Source: Ambulatory Visit | Attending: Surgery

## 2018-03-22 ENCOUNTER — Encounter (HOSPITAL_BASED_OUTPATIENT_CLINIC_OR_DEPARTMENT_OTHER): Payer: Self-pay | Admitting: Anesthesiology

## 2018-03-22 ENCOUNTER — Ambulatory Visit (HOSPITAL_BASED_OUTPATIENT_CLINIC_OR_DEPARTMENT_OTHER): Payer: BLUE CROSS/BLUE SHIELD | Admitting: Anesthesiology

## 2018-03-22 ENCOUNTER — Ambulatory Visit
Admission: RE | Admit: 2018-03-22 | Discharge: 2018-03-22 | Disposition: A | Discharge status: Home / Self Care | Payer: BLUE CROSS/BLUE SHIELD | Source: Ambulatory Visit | Attending: Surgery | Admitting: Surgery

## 2018-03-22 ENCOUNTER — Other Ambulatory Visit: Payer: Self-pay

## 2018-03-22 DIAGNOSIS — D0512 Intraductal carcinoma in situ of left breast: Secondary | ICD-10-CM

## 2018-03-22 DIAGNOSIS — E119 Type 2 diabetes mellitus without complications: Secondary | ICD-10-CM | POA: Insufficient documentation

## 2018-03-22 DIAGNOSIS — Z79899 Other long term (current) drug therapy: Secondary | ICD-10-CM | POA: Diagnosis not present

## 2018-03-22 DIAGNOSIS — E7800 Pure hypercholesterolemia, unspecified: Secondary | ICD-10-CM | POA: Insufficient documentation

## 2018-03-22 DIAGNOSIS — Z79891 Long term (current) use of opiate analgesic: Secondary | ICD-10-CM | POA: Diagnosis not present

## 2018-03-22 DIAGNOSIS — I509 Heart failure, unspecified: Secondary | ICD-10-CM | POA: Insufficient documentation

## 2018-03-22 DIAGNOSIS — Z170 Estrogen receptor positive status [ER+]: Secondary | ICD-10-CM | POA: Diagnosis not present

## 2018-03-22 DIAGNOSIS — I2510 Atherosclerotic heart disease of native coronary artery without angina pectoris: Secondary | ICD-10-CM | POA: Diagnosis not present

## 2018-03-22 DIAGNOSIS — I252 Old myocardial infarction: Secondary | ICD-10-CM | POA: Insufficient documentation

## 2018-03-22 DIAGNOSIS — Z87891 Personal history of nicotine dependence: Secondary | ICD-10-CM | POA: Diagnosis not present

## 2018-03-22 HISTORY — PX: BREAST LUMPECTOMY WITH RADIOACTIVE SEED LOCALIZATION: SHX6424

## 2018-03-22 LAB — GLUCOSE, CAPILLARY: GLUCOSE-CAPILLARY: 75 mg/dL (ref 65–99)

## 2018-03-22 SURGERY — BREAST LUMPECTOMY WITH RADIOACTIVE SEED LOCALIZATION
Anesthesia: General | Site: Breast | Laterality: Left

## 2018-03-22 MED ORDER — LIDOCAINE HCL (CARDIAC) 20 MG/ML IV SOLN
INTRAVENOUS | Status: DC | PRN
  Administered 2018-03-22: 100 mg via INTRAVENOUS

## 2018-03-22 MED ORDER — CHLORHEXIDINE GLUCONATE CLOTH 2 % EX PADS: 6.0000 | MEDICATED_PAD | Freq: Once | CUTANEOUS | Status: DC

## 2018-03-22 MED ORDER — GABAPENTIN 300 MG PO CAPS
300.0000 mg | ORAL_CAPSULE | ORAL | Status: AC
  Administered 2018-03-22: 300 mg via ORAL

## 2018-03-22 MED ORDER — ACETAMINOPHEN 500 MG PO TABS
1000.0000 mg | ORAL_TABLET | ORAL | Status: AC
  Administered 2018-03-22: 1000 mg via ORAL

## 2018-03-22 MED ORDER — LACTATED RINGERS IV SOLN
INTRAVENOUS | Status: DC
  Administered 2018-03-22: 07:00:00 via INTRAVENOUS

## 2018-03-22 MED ORDER — FENTANYL CITRATE (PF) 100 MCG/2ML IJ SOLN
INTRAMUSCULAR | Status: AC
  Filled 2018-03-22: qty 2

## 2018-03-22 MED ORDER — OXYCODONE HCL 5 MG/5ML PO SOLN: 5.0000 mg | Freq: Once | ORAL | Status: DC | PRN

## 2018-03-22 MED ORDER — ONDANSETRON HCL 4 MG/2ML IJ SOLN
INTRAMUSCULAR | Status: AC
  Filled 2018-03-22: qty 2

## 2018-03-22 MED ORDER — HYDROMORPHONE HCL 1 MG/ML IJ SOLN
0.2500 mg | INTRAMUSCULAR | Status: DC | PRN
Start: 2018-03-22 — End: 2018-03-22

## 2018-03-22 MED ORDER — CELECOXIB 200 MG PO CAPS
200.0000 mg | ORAL_CAPSULE | ORAL | Status: AC
  Administered 2018-03-22: 200 mg via ORAL

## 2018-03-22 MED ORDER — MIDAZOLAM HCL 2 MG/2ML IJ SOLN
INTRAMUSCULAR | Status: AC
  Filled 2018-03-22: qty 2

## 2018-03-22 MED ORDER — CEFAZOLIN SODIUM-DEXTROSE 2-4 GM/100ML-% IV SOLN
2.0000 g | INTRAVENOUS | Status: AC
  Administered 2018-03-22: 2 g via INTRAVENOUS

## 2018-03-22 MED ORDER — ONDANSETRON HCL 4 MG/2ML IJ SOLN
INTRAMUSCULAR | Status: DC | PRN
  Administered 2018-03-22: 4 mg via INTRAVENOUS

## 2018-03-22 MED ORDER — FENTANYL CITRATE (PF) 100 MCG/2ML IJ SOLN
50.0000 ug | INTRAMUSCULAR | Status: DC | PRN
  Administered 2018-03-22 (×2): 50 ug via INTRAVENOUS

## 2018-03-22 MED ORDER — PROMETHAZINE HCL 25 MG/ML IJ SOLN: 6.2500 mg | INTRAMUSCULAR | Status: DC | PRN

## 2018-03-22 MED ORDER — CELECOXIB 200 MG PO CAPS
ORAL_CAPSULE | ORAL | Status: AC
  Filled 2018-03-22: qty 1

## 2018-03-22 MED ORDER — PHENYLEPHRINE HCL 10 MG/ML IJ SOLN
INTRAMUSCULAR | Status: DC | PRN
  Administered 2018-03-22 (×2): 80 ug via INTRAVENOUS

## 2018-03-22 MED ORDER — PROPOFOL 500 MG/50ML IV EMUL
INTRAVENOUS | Status: AC
  Filled 2018-03-22: qty 50

## 2018-03-22 MED ORDER — OXYCODONE HCL 5 MG PO TABS: 5.0000 mg | ORAL_TABLET | Freq: Once | ORAL | Status: DC | PRN

## 2018-03-22 MED ORDER — DEXAMETHASONE SODIUM PHOSPHATE 10 MG/ML IJ SOLN
INTRAMUSCULAR | Status: AC
  Filled 2018-03-22: qty 1

## 2018-03-22 MED ORDER — GABAPENTIN 300 MG PO CAPS
ORAL_CAPSULE | ORAL | Status: AC
Start: 2018-03-22 — End: ?
  Filled 2018-03-22: qty 1

## 2018-03-22 MED ORDER — ACETAMINOPHEN 500 MG PO TABS
ORAL_TABLET | ORAL | Status: AC
  Filled 2018-03-22: qty 2

## 2018-03-22 MED ORDER — PROPOFOL 10 MG/ML IV BOLUS
INTRAVENOUS | Status: DC | PRN
  Administered 2018-03-22: 180 mg via INTRAVENOUS

## 2018-03-22 MED ORDER — IBUPROFEN 800 MG PO TABS
800.0000 mg | ORAL_TABLET | Freq: Three times a day (TID) | ORAL | 0 refills | Status: DC | PRN
Start: 2018-03-22 — End: 2018-04-06

## 2018-03-22 MED ORDER — BUPIVACAINE-EPINEPHRINE (PF) 0.25% -1:200000 IJ SOLN
INTRAMUSCULAR | Status: AC
  Filled 2018-03-22: qty 120

## 2018-03-22 MED ORDER — SCOPOLAMINE 1 MG/3DAYS TD PT72: 1.0000 | MEDICATED_PATCH | Freq: Once | TRANSDERMAL | Status: DC | PRN

## 2018-03-22 MED ORDER — MIDAZOLAM HCL 2 MG/2ML IJ SOLN
1.0000 mg | INTRAMUSCULAR | Status: DC | PRN
  Administered 2018-03-22: 2 mg via INTRAVENOUS

## 2018-03-22 MED ORDER — BUPIVACAINE-EPINEPHRINE (PF) 0.25% -1:200000 IJ SOLN
INTRAMUSCULAR | Status: DC | PRN
  Administered 2018-03-22: 20 mL

## 2018-03-22 MED ORDER — DEXAMETHASONE SODIUM PHOSPHATE 4 MG/ML IJ SOLN
INTRAMUSCULAR | Status: DC | PRN
  Administered 2018-03-22: 10 mg via INTRAVENOUS

## 2018-03-22 MED ORDER — OXYCODONE HCL 5 MG PO TABS
5.0000 mg | ORAL_TABLET | Freq: Four times a day (QID) | ORAL | 0 refills | Status: DC | PRN
Start: 2018-03-22 — End: 2018-04-06

## 2018-03-22 MED ORDER — CEFAZOLIN SODIUM-DEXTROSE 2-4 GM/100ML-% IV SOLN
INTRAVENOUS | Status: AC
  Filled 2018-03-22: qty 100

## 2018-03-22 SURGICAL SUPPLY — 53 items
ADH SKN CLS APL DERMABOND .7 (GAUZE/BANDAGES/DRESSINGS) ×1
APPLIER CLIP 9.375 MED OPEN (MISCELLANEOUS)
APR CLP MED 9.3 20 MLT OPN (MISCELLANEOUS)
BINDER BREAST LRG (GAUZE/BANDAGES/DRESSINGS) ×3 IMPLANT
BINDER BREAST MEDIUM (GAUZE/BANDAGES/DRESSINGS) IMPLANT
BLADE SURG 15 STRL LF DISP TIS (BLADE) ×1 IMPLANT
BLADE SURG 15 STRL SS (BLADE) ×3
CANISTER SUC SOCK COL 7IN (MISCELLANEOUS) IMPLANT
CANISTER SUCT 1200ML W/VALVE (MISCELLANEOUS) IMPLANT
CHLORAPREP W/TINT 26ML (MISCELLANEOUS) ×3 IMPLANT
CLIP APPLIE 9.375 MED OPEN (MISCELLANEOUS) IMPLANT
COVER BACK TABLE 60X90IN (DRAPES) ×3 IMPLANT
COVER MAYO STAND STRL (DRAPES) ×3 IMPLANT
COVER PROBE W GEL 5X96 (DRAPES) ×3 IMPLANT
DECANTER SPIKE VIAL GLASS SM (MISCELLANEOUS) IMPLANT
DERMABOND ADVANCED (GAUZE/BANDAGES/DRESSINGS) ×2
DERMABOND ADVANCED .7 DNX12 (GAUZE/BANDAGES/DRESSINGS) ×1 IMPLANT
DEVICE DUBIN W/COMP PLATE 8390 (MISCELLANEOUS) ×3 IMPLANT
DRAPE LAPAROSCOPIC ABDOMINAL (DRAPES) IMPLANT
DRAPE LAPAROTOMY 100X72 PEDS (DRAPES) ×3 IMPLANT
DRAPE UTILITY XL STRL (DRAPES) ×3 IMPLANT
ELECT COATED BLADE 2.86 ST (ELECTRODE) ×3 IMPLANT
ELECT REM PT RETURN 9FT ADLT (ELECTROSURGICAL) ×3
ELECTRODE REM PT RTRN 9FT ADLT (ELECTROSURGICAL) ×1 IMPLANT
GLOVE BIOGEL PI IND STRL 7.5 (GLOVE) ×2 IMPLANT
GLOVE BIOGEL PI IND STRL 8 (GLOVE) ×1 IMPLANT
GLOVE BIOGEL PI INDICATOR 7.5 (GLOVE) ×4
GLOVE BIOGEL PI INDICATOR 8 (GLOVE) ×2
GLOVE ECLIPSE 8.0 STRL XLNG CF (GLOVE) ×3 IMPLANT
GLOVE SURG SS PI 7.5 STRL IVOR (GLOVE) ×3 IMPLANT
GLOVE SURG SYN 7.5  E (GLOVE) ×2
GLOVE SURG SYN 7.5 E (GLOVE) ×1 IMPLANT
GLOVE SURG SYN 7.5 PF PI (GLOVE) IMPLANT
GOWN STRL REUS W/ TWL LRG LVL3 (GOWN DISPOSABLE) ×2 IMPLANT
GOWN STRL REUS W/TWL LRG LVL3 (GOWN DISPOSABLE) ×6
HEMOSTAT ARISTA ABSORB 3G PWDR (MISCELLANEOUS) IMPLANT
HEMOSTAT SNOW SURGICEL 2X4 (HEMOSTASIS) IMPLANT
KIT MARKER MARGIN INK (KITS) ×3 IMPLANT
NEEDLE HYPO 25X1 1.5 SAFETY (NEEDLE) ×3 IMPLANT
NS IRRIG 1000ML POUR BTL (IV SOLUTION) ×3 IMPLANT
PACK BASIN DAY SURGERY FS (CUSTOM PROCEDURE TRAY) ×3 IMPLANT
PENCIL BUTTON HOLSTER BLD 10FT (ELECTRODE) ×3 IMPLANT
SLEEVE SCD COMPRESS KNEE MED (MISCELLANEOUS) ×3 IMPLANT
SPONGE LAP 4X18 X RAY DECT (DISPOSABLE) ×3 IMPLANT
SUT MNCRL AB 4-0 PS2 18 (SUTURE) ×3 IMPLANT
SUT SILK 2 0 SH (SUTURE) IMPLANT
SUT VICRYL 3-0 CR8 SH (SUTURE) ×3 IMPLANT
SYR CONTROL 10ML LL (SYRINGE) ×3 IMPLANT
TOWEL OR 17X24 6PK STRL BLUE (TOWEL DISPOSABLE) ×3 IMPLANT
TOWEL OR NON WOVEN STRL DISP B (DISPOSABLE) ×3 IMPLANT
TUBE CONNECTING 20'X1/4 (TUBING) ×1
TUBE CONNECTING 20X1/4 (TUBING) ×1 IMPLANT
YANKAUER SUCT BULB TIP NO VENT (SUCTIONS) ×3 IMPLANT

## 2018-03-22 NOTE — Discharge Instructions (Signed)
[facility] °Office Phone Number [phone] ° °BREAST BIOPSY/ PARTIAL MASTECTOMY: POST OP INSTRUCTIONS ° °Always review your discharge instruction sheet given to you by the facility where your surgery was performed. ° °IF YOU HAVE DISABILITY OR FAMILY LEAVE FORMS, YOU MUST BRING THEM TO THE OFFICE FOR PROCESSING.  DO NOT GIVE THEM TO YOUR DOCTOR. ° °1. A prescription for pain medication may be given to you upon discharge.  Take your pain medication as prescribed, if needed.  If narcotic pain medicine is not needed, then you may take acetaminophen (Tylenol) or ibuprofen (Advil) as needed. °2. Take your usually prescribed medications unless otherwise directed °3. If you need a refill on your pain medication, please contact your pharmacy.  They will contact our office to request authorization.  Prescriptions will not be filled after 5pm or on week-ends. °4. You should eat very light the first 24 hours after surgery, such as soup, crackers, pudding, etc.  Resume your normal diet the day after surgery. °5. Most patients will experience some swelling and bruising in the breast.  Ice packs and a good support bra will help.  Swelling and bruising can take several days to resolve.  °6. It is common to experience some constipation if taking pain medication after surgery.  Increasing fluid intake and taking a stool softener will usually help or prevent this problem from occurring.  A mild laxative (Milk of Magnesia or Miralax) should be taken according to package directions if there are no bowel movements after 48 hours. °7. Unless discharge instructions indicate otherwise, you may remove your bandages 24-48 hours after surgery, and you may shower at that time.  You may have steri-strips (small skin tapes) in place directly over the incision.  These strips should be left on the skin for 7-10 days.  If your surgeon used skin glue on the incision, you may shower in 24 hours.  The glue will flake off over the  next 2-3 weeks.  Any sutures or staples will be removed at the office during your follow-up visit. °8. ACTIVITIES:  You may resume regular daily activities (gradually increasing) beginning the next day.  Wearing a good support bra or sports bra minimizes pain and swelling.  You may have sexual intercourse when it is comfortable. °a. You may drive when you no longer are taking prescription pain medication, you can comfortably wear a seatbelt, and you can safely maneuver your car and apply brakes. °b. RETURN TO WORK:  ______________________________________________________________________________________ °9. You should see your doctor in the office for a follow-up appointment approximately two weeks after your surgery.  Your doctor’s nurse will typically make your follow-up appointment when she calls you with your pathology report.  Expect your pathology report 2-3 business days after your surgery.  You may call to check if you do not hear from us after three days. °10. OTHER INSTRUCTIONS: _______________________________________________________________________________________________ _____________________________________________________________________________________________________________________________________ °_____________________________________________________________________________________________________________________________________ °_____________________________________________________________________________________________________________________________________ ° °WHEN TO CALL YOUR DOCTOR: °1. Fever over 101.0 °2. Nausea and/or vomiting. °3. Extreme swelling or bruising. °4. Continued bleeding from incision. °5. Increased pain, redness, or drainage from the incision. ° °The clinic staff is available to answer your questions during regular business hours.  Please don’t hesitate to call and ask to speak to one of the nurses for clinical concerns.  If you have a medical emergency, go to the nearest  emergency room or call 911.  A surgeon from [facility] is always on call at the hospital. ° °For further questions, please visit [email or web address]  ° ° ° ° °  Post Anesthesia Home Care Instructions ° °Activity: °Get plenty of rest for the remainder of the day. A responsible individual must stay with you for 24 hours following the procedure.  °For the next 24 hours, DO NOT: °-Drive a car °-Operate machinery °-Drink alcoholic beverages °-Take any medication unless instructed by your physician °-Make any legal decisions or sign important papers. ° °Meals: °Start with liquid foods such as gelatin or soup. Progress to regular foods as tolerated. Avoid greasy, spicy, heavy foods. If nausea and/or vomiting occur, drink only clear liquids until the nausea and/or vomiting subsides. Call your physician if vomiting continues. ° °Special Instructions/Symptoms: °Your throat may feel dry or sore from the anesthesia or the breathing tube placed in your throat during surgery. If this causes discomfort, gargle with warm salt water. The discomfort should disappear within 24 hours. ° °If you had a scopolamine patch placed behind your ear for the management of post- operative nausea and/or vomiting: ° °1. The medication in the patch is effective for 72 hours, after which it should be removed.  Wrap patch in a tissue and discard in the trash. Wash hands thoroughly with soap and water. °2. You may remove the patch earlier than 72 hours if you experience unpleasant side effects which may include dry mouth, dizziness or visual disturbances. °3. Avoid touching the patch. Wash your hands with soap and water after contact with the patch. °  ° °

## 2018-03-22 NOTE — Transfer of Care (Signed)
Immediate Anesthesia Transfer of Care Note  Patient: Deanna Roberts  Procedure(s) Performed: LEFT BREAST LUMPECTOMY WITH BRACKETED RADIOACTIVE SEED LOCALIZATION (Left Breast)  Patient Location: PACU  Anesthesia Type:General  Level of Consciousness: sedated  Airway & Oxygen Therapy: Patient Spontanous Breathing and Patient connected to face mask oxygen  Post-op Assessment: Report given to RN and Post -op Vital signs reviewed and stable  Post vital signs: Reviewed and stable  Last Vitals:  Vitals Value Taken Time  BP 120/73 03/22/2018  8:33 AM  Temp    Pulse 89 03/22/2018  8:33 AM  Resp 16 03/22/2018  8:33 AM  SpO2 100 % 03/22/2018  8:33 AM  Vitals shown include unvalidated device data.  Last Pain:  Vitals:   03/22/18 0651  TempSrc: Oral         Complications: No apparent anesthesia complications

## 2018-03-22 NOTE — Op Note (Signed)
Preoperative diagnosis: DCIS  left breast cancer upper-outer quadrant   Postoperative diagnosis: Same   Procedure: Left breast seed localized lumpectomy WITH 2 SEEDS  Surgeon: Erroll Luna M.D.   Anesthesia: LMA with LOCAL  anesthesia   EBL: 20 cc   Specimen: Left breast mass with 2 SEEDS    Drains: None   Indications for procedure: patient presents for left breast lumpectomy for DCIS. Risks and benefits of surgery discussed. Options discussed mastectomy and reconstruction. Cosmetic outcome given large area of DCIS discussed with the patient. She saw plastic surgery preop and opted against mastectomy and wished to try breast conservation given the fact that she would have some cosmesis issues postop.The procedure has been discussed with the patient. Alternatives to surgery have been discussed with the patient.  Risks of surgery include bleeding,  Infection,  Seroma formation, death,  and the need for further surgery.   The patient understands and wishes to proceed.  Description of procedure: The patient was met in the holding area. Questions are answered. She received two seed in the  left breast for bracketing purposes. She's taken back to the operating room. She was placed upon the operating room table. After induction of LMA anesthesia, left breast was prepped and draped in sterile fashion. Timeout performed. Neoprobe was used to identify proceed. A transverse incision was made over the area. Dissection was carried around both seeds of procedure removed one specimen with grossly negative margins. Hemostasis achieved. Faxitron revealed both seeds in both clips to be in the specimen with the calcifications. It was made hemostatic with cautery and closed with 3-0 Vicryl for Monocryl. All final counts are found to be correct. Patient was awoke extubated taken recovery in satisfactory condition.

## 2018-03-22 NOTE — Anesthesia Procedure Notes (Signed)
Procedure Name: LMA Insertion Date/Time: 03/22/2018 7:36 AM Performed by: Maryella Shivers, CRNA Pre-anesthesia Checklist: Patient identified, Emergency Drugs available, Suction available and Patient being monitored Patient Re-evaluated:Patient Re-evaluated prior to induction Oxygen Delivery Method: Circle system utilized Preoxygenation: Pre-oxygenation with 100% oxygen Induction Type: IV induction Ventilation: Mask ventilation without difficulty LMA: LMA inserted LMA Size: 4.0 Number of attempts: 1 Airway Equipment and Method: Bite block Placement Confirmation: positive ETCO2 Tube secured with: Tape Dental Injury: Teeth and Oropharynx as per pre-operative assessment

## 2018-03-22 NOTE — Anesthesia Postprocedure Evaluation (Signed)
Anesthesia Post Note  Patient: Deanna Roberts  Procedure(s) Performed: LEFT BREAST LUMPECTOMY WITH BRACKETED RADIOACTIVE SEED LOCALIZATION (Left Breast)     Patient location during evaluation: PACU Anesthesia Type: General Level of consciousness: awake and alert Pain management: pain level controlled Vital Signs Assessment: post-procedure vital signs reviewed and stable Respiratory status: spontaneous breathing, nonlabored ventilation, respiratory function stable and patient connected to nasal cannula oxygen Cardiovascular status: blood pressure returned to baseline and stable Postop Assessment: no apparent nausea or vomiting Anesthetic complications: no    Last Vitals:  Vitals:   03/22/18 0835 03/22/18 0920  BP:  133/85  Pulse: 95 76  Resp: (!) 21 16  Temp:  36.6 C  SpO2: 100% 100%    Last Pain:  Vitals:   03/22/18 0920  TempSrc: Oral  PainSc: 0-No pain                 Ryan P Ellender

## 2018-03-22 NOTE — Interval H&P Note (Signed)
History and Physical Interval Note:  03/22/2018 7:19 AM  Deanna Roberts  has presented today for surgery, with the diagnosis of LEFT DCIS  The various methods of treatment have been discussed with the patient and family. After consideration of risks, benefits and other options for treatment, the patient has consented to  Procedure(s): LEFT BREAST LUMPECTOMY WITH BRACKETED RADIOACTIVE SEED LOCALIZATION (Left) as a surgical intervention .  The patient's history has been reviewed, patient examined, no change in status, stable for surgery.  I have reviewed the patient's chart and labs.  Questions were answered to the patient's satisfaction.     [place]

## 2018-03-22 NOTE — H&P (Addendum)
Deanna Roberts Documented: Location: [facility] Patient #: [phone] DOB: [birth year] Single / Language: [name] / Race: Black or African American Female   History of Present IllnessPatient words: Patient sent at the request of Dr. Remer Macho for screening detected left breast microcalcifications. The area measures 6 cm left breast upper outer quadrant. 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 Patient denies any history of breast pain, nipple discharge or breast mass. She has no family history of breast cancer.                Patient was called back from screening mammogram for left breast calcifications  EXAM: DIGITAL DIAGNOSTIC LEFT MAMMOGRAM WITH CAD  COMPARISON: Previous exam(s).  ACR Breast Density Category c: The breast tissue is heterogeneously dense, which may obscure small masses.  FINDINGS: Additional imaging of the left breast was performed. There are calcifications in the upper-outer quadrant of the left breast spanning an area of approximately 6.8 cm. Calcifications have developed from prior exams and are indeterminate. There is no associated mass.  Mammographic images were processed with CAD.  IMPRESSION: Indeterminate calcifications in the upper-outer quadrant of the left breast.  RECOMMENDATION: Stereotactic biopsy of the left breast calcifications is recommended. The stereotactic biopsy will be scheduled at the patient's convenience.  I have discussed the findings and recommendations with the patient. Results were also provided in writing at the conclusion of the visit. If applicable, a reminder letter will be sent to the patient regarding the next appointment.  BI-RADS CATEGORY 4: Suspicious.   Electronically Signed By: Lillia Mountain M.D. On: 12/27/2017 16:09        ADDITIONAL INFORMATION: PROGNOSTIC INDICATORS Results: IMMUNOHISTOCHEMICAL AND  MORPHOMETRIC ANALYSIS PERFORMED MANUALLY Estrogen Receptor: 100%, POSITIVE, STRONG STAINING INTENSITY Progesterone Receptor: 30%, POSITIVE, MODERATE STAINING INTENSITY REFERENCE RANGE ESTROGEN RECEPTOR NEGATIVE 0% POSITIVE =>1% REFERENCE RANGE PROGESTERONE RECEPTOR NEGATIVE 0% POSITIVE =>1% All controls stained appropriately [name] MD Pathologist, Electronic Signature   Diagnosis Breast, left, needle core biopsy, UOQ - DUCTAL CARCINOMA IN SITU WITH CALCIFICATIONS. - SEE COMMENT. Microscopic Comment The carcinoma appears intermediate grade. Estrogen receptor and progesterone receptor studies will be performed    Diagnosis Breast, left, needle core biopsy, upper outer quadrant at posterior depth - DUCTAL CARCINOMA IN SITU WITH CALCIFICATIONS, SEE COMMENT. Microscopic Comment The carcinoma appears intermediate grade. Prognostic markers will be ordered. The case was called to [facility] on 01/05/2018. Vicente Males MD Pathologist, Electronic Signature (Case signed 01/05/2018) Specimen.  The patient is a 52 year old female.   Past Surgical History (Tanisha A. Owens Shark, RMA Breast Biopsy  Left. Hysterectomy (not due to cancer) - Complete   Diagnostic Studies History (Tanisha A. Owens Shark, [place]; Colonoscopy  1-5 years ago Mammogram  within last year Pap Smear  1-5 years ago  Allergies (Tanisha A. Owens Shark, [place]; 01/05/2018 10:16 AM) No Known Drug Allergies [01/05/2018]: Allergies Reconciled   Medication History (Tanisha A. Owens Shark, [place];  Hydrocodone-Acetaminophen (5-325MG  Tablet, Oral) Active. Lisinopril (10MG  Tablet, Oral) Active. HydroCHLOROthiazide (25MG  Tablet, Oral) Active. Pravastatin Sodium (20MG  Tablet, Oral) Active. Ezetimibe (10MG  Tablet, Oral) Active. Medications Reconciled  Social History (Tanisha A. Owens Shark, [place]; Alcohol use  Occasional alcohol use. Caffeine use  Coffee. No drug use  Tobacco use  Former  smoker.  Family History (Tanisha A. Owens Shark, RMAArthritis  Mother. Breast Cancer  Mother. Cancer  Mother. Diabetes Mellitus  Mother. Heart Disease  Mother, Sister. Hypertension  Mother, Sister. Kidney Disease  Mother. Prostate Cancer  Father.  Seizure disorder  Mother.  Pregnancy / Birth History (Tanisha A. Owens Shark, [place]; Age at menarche  72 years. Age of menopause  33-50 Gravida  1 Irregular periods  Maternal age  17-40 Para  0  Other Problems (Tanisha A. Owens Shark, [place]; Arthritis  Breast Cancer  Congestive Heart Failure  Diabetes Mellitus  High blood pressure  Hypercholesterolemia  Lump In Breast  Myocardial infarction     Review of Systems (Tanisha A. Owens Shark RMA; General Not Present- Appetite Loss, Chills, Fatigue, Fever, Night Sweats, Weight Gain and Weight Loss. Skin Not Present- Change in Wart/Mole, Dryness, Hives, Jaundice, New Lesions, Non-Healing Wounds, Rash and Ulcer. HEENT Present- Hoarseness and Wears glasses/contact lenses. Not Present- Earache, Hearing Loss, Nose Bleed, Oral Ulcers, Ringing in the Ears, Seasonal Allergies, Sinus Pain, Sore Throat, Visual Disturbances and Yellow Eyes. Respiratory Not Present- Bloody sputum, Chronic Cough, Difficulty Breathing, Snoring and Wheezing. Breast Not Present- Breast Mass, Breast Pain, Nipple Discharge and Skin Changes. Cardiovascular Present- Leg Cramps. Not Present- Chest Pain, Difficulty Breathing Lying Down, Palpitations, Rapid Heart Rate, Shortness of Breath and Swelling of Extremities. Gastrointestinal Not Present- Abdominal Pain, Bloating, Bloody Stool, Change in Bowel Habits, Chronic diarrhea, Constipation, Difficulty Swallowing, Excessive gas, Gets full quickly at meals, Hemorrhoids, Indigestion, Nausea, Rectal Pain and Vomiting. Female Genitourinary Not Present- Frequency, Nocturia, Painful Urination, Pelvic Pain and Urgency. Musculoskeletal Present- Joint Pain, Joint Stiffness, Muscle Pain and  Muscle Weakness. Not Present- Back Pain and Swelling of Extremities. Neurological Present- Headaches. Not Present- Decreased Memory, Fainting, Numbness, Seizures, Tingling, Tremor, Trouble walking and Weakness. Psychiatric Not Present- Anxiety, Bipolar, Change in Sleep Pattern, Depression, Fearful and Frequent crying. Endocrine Present- Hot flashes. Not Present- Cold Intolerance, Excessive Hunger, Hair Changes, Heat Intolerance and New Diabetes. Hematology Not Present- Blood Thinners, Easy Bruising, Excessive bleeding, Gland problems, HIV and Persistent Infections.  Vitals (Tanisha A. Brown RMA; 01/05/2018 10:16 AM) 01/05/2018 10:15 AM Weight: 144.4 lb Height: 66in Body Surface Area: 1.74 m Body Mass Index: 23.31 kg/m  Temp.: 98.16F  Pulse: 78 (Regular)  BP: 126/84 (Sitting, Left Arm, Standard)       Physical Exam (Hayzlee Mcsorley A. Kanani Mowbray MD;  General Mental Status-Alert. General Appearance-Consistent with stated age. Hydration-Well hydrated. Voice-Normal.  Head and Neck Head-normocephalic, atraumatic with no lesions or palpable masses. Trachea-midline. Thyroid Gland Characteristics - normal size and consistency.  Eye Eyeball - Bilateral-Extraocular movements intact. Sclera/Conjunctiva - Bilateral-No scleral icterus.  Chest and Lung Exam Chest and lung exam reveals -quiet, even and easy respiratory effort with no use of accessory muscles and on auscultation, normal breath sounds, no adventitious sounds and normal vocal resonance. Inspection Chest Wall - Normal. Back - normal.  Breast Breast - Left-Symmetric, Non Tender, No Biopsy scars, no Dimpling, No Inflammation, No Lumpectomy scars, No Mastectomy scars, No Peau d' Orange. Breast - Right-Symmetric, Non Tender, No Biopsy scars, no Dimpling, No Inflammation, No Lumpectomy scars, No Mastectomy scars, No Peau d' Orange. Breast Lump-No Palpable Breast Mass. Note: Mild bruising left  breast.   Cardiovascular Cardiovascular examination reveals -normal heart sounds, regular rate and rhythm with no murmurs and normal pedal pulses bilaterally.  Abdomen Inspection Inspection of the abdomen reveals - No Hernias. Skin - Scar - no surgical scars. Palpation/Percussion Palpation and Percussion of the abdomen reveal - Soft, Non Tender, No Rebound tenderness, No Rigidity (guarding) and No hepatosplenomegaly. Auscultation Auscultation of the abdomen reveals - Bowel sounds normal.  Neurologic Neurologic evaluation reveals -alert and oriented x 3 with no impairment of recent or remote memory. Mental  Status-Normal.  Musculoskeletal Normal Exam - Left-Upper Extremity Strength Normal and Lower Extremity Strength Normal. Normal Exam - Right-Upper Extremity Strength Normal and Lower Extremity Strength Normal.  Lymphatic Head & Neck  General Head & Neck Lymphatics: Bilateral - Description - Normal. Axillary  General Axillary Region: Bilateral - Description - Normal. Tenderness - Non Tender. Femoral & Inguinal - Did not examine.    Assessment & Plan (Armya Westerhoff A. Cherae Marton MD; BREAST NEOPLASM, TIS (DCIS), LEFT (D05.12) Impression:Pt has opted for left breast lumpectomy She understands that the area is large and may have some cosmesis issues but would like to conserve her breast.  She also has evaluated mastectomy and reconstruction  The procedure has been discussed with the patient. Alternatives to surgery have been discussed with the patient.  Risks of surgery include bleeding,  Infection,  Seroma formation, death,  and the need for further surgery.   The patient understands and wishes to proceed.      Current Plans Pt Education - [facility] Breast Cancer Information Given - [facility] "Breast Journey" Package Pt Education - [facility] Mastectomy HCI Pt Education - [facility] Breast Biopsy HCI: discussed with patient and provided information.

## 2018-03-23 ENCOUNTER — Encounter (HOSPITAL_BASED_OUTPATIENT_CLINIC_OR_DEPARTMENT_OTHER): Payer: Self-pay | Admitting: Surgery

## 2018-03-26 ENCOUNTER — Telehealth: Payer: Self-pay | Admitting: Radiation Oncology

## 2018-03-26 NOTE — Telephone Encounter (Signed)
Spoke with patient confirming FMLA paperwork has been successfully faxed to [place] [employer] [place] to [place], at [phone]. Patient requested a copy be mailed to her as well.

## 2018-03-27 NOTE — Progress Notes (Signed)
Location of Breast Cancer: Left Breast  Histology per Pathology Report:  01/01/18 Diagnosis Breast, left, needle core biopsy, UOQ - DUCTAL CARCINOMA IN SITU WITH CALCIFICATIONS Receptor Status: ER(90%), PR (30%)  01/04/18 Diagnosis Breast, left, needle core biopsy, upper outer quadrant at posterior depth - DUCTAL CARCINOMA IN SITU WITH CALCIFICATIONS, SEE COMMENT. Receptor status: ER (90%), PR (2%).   03/22/18 Diagnosis Breast, lumpectomy, Left w/seed x2 - MULTIFOCAL DUCTAL CARCINOMA IN SITU. - DCIS FOCALLY LESS THAN 0.1 CM FROM POSTERIOR MARGIN, 0.1 CM FROM LATERAL AND SUPERIOR MARGIN AND 0.3 CM FROM ANTERIOR MARGIN. - PREVIOUS BIOPSY SITES AND BIOPSY CLIPS. - FIBROCYSTIC CHANGES WITH CALCIFICATIONS. - NO INVASIVE CARCINOMA  Did patient present with symptoms or was this found on screening mammography?: It was found on a screening mammogram.   Past/Anticipated interventions by surgeon, if any: 03/22/18 Procedure: Left breast seed localized lumpectomy WITH 2 SEEDS Surgeon: Erroll Luna M.D.    Past/Anticipated interventions by medical oncology, if any:  Dr. Burr Medico 01/26/18 Plan -f/u appointment is open depending on her decision about surgery and participating of COMET clinical trial  -genetic referral    Lymphedema issues, if any:  She denies, She reports good arm mobility, but is unable to do strenuous lifting.   Pain issues, if any:  She denies  SAFETY ISSUES:  Prior radiation? No  Pacemaker/ICD? No  Possible current pregnancy? No, hx of hysterectomy  Is the patient on methotrexate? No  Current Complaints / other details:    BP (!) 149/77   Pulse 66   Temp 98.6 F (37 C)   Resp 16   Ht 5\' 7"  (1.702 m)   Wt 137 lb 9.6 oz (62.4 kg)   SpO2 100% Comment: room air  BMI 21.55 kg/m    Wt Readings from Last 3 Encounters:  04/06/18 137 lb 9.6 oz (62.4 kg)  03/22/18 141 lb 6 oz (64.1 kg)  01/30/18 139 lb (63 kg)

## 2018-04-06 ENCOUNTER — Ambulatory Visit
Admission: RE | Admit: 2018-04-06 | Discharge: 2018-04-06 | Disposition: A | Discharge status: Home / Self Care | Payer: BLUE CROSS/BLUE SHIELD | Source: Ambulatory Visit | Attending: Radiation Oncology | Admitting: Radiation Oncology

## 2018-04-06 ENCOUNTER — Other Ambulatory Visit: Payer: Self-pay

## 2018-04-06 ENCOUNTER — Encounter: Payer: Self-pay | Admitting: Radiation Oncology

## 2018-04-06 VITALS — BP 149/77 | HR 66 | Temp 98.6°F | Resp 16 | Ht 67.0 in | Wt 137.6 lb

## 2018-04-06 DIAGNOSIS — Z170 Estrogen receptor positive status [ER+]: Secondary | ICD-10-CM | POA: Insufficient documentation

## 2018-04-06 DIAGNOSIS — Z79899 Other long term (current) drug therapy: Secondary | ICD-10-CM | POA: Insufficient documentation

## 2018-04-06 DIAGNOSIS — C50412 Malignant neoplasm of upper-outer quadrant of left female breast: Secondary | ICD-10-CM

## 2018-04-06 DIAGNOSIS — D0512 Intraductal carcinoma in situ of left breast: Secondary | ICD-10-CM | POA: Diagnosis present

## 2018-04-06 DIAGNOSIS — Z7982 Long term (current) use of aspirin: Secondary | ICD-10-CM | POA: Insufficient documentation

## 2018-04-06 NOTE — Progress Notes (Signed)
Radiation Oncology         [phone] ________________________________  Name: Deanna Roberts MRN: [id number]  Date: 04/06/2018  DOB: 11/28/1966  Follow-Up Visit Note  Outpatient  CC: Smith, Karla, MD  Feng, Yan, MD  Diagnosis:      ICD-10-CM   1. Carcinoma of upper-outer quadrant of left breast in female, estrogen receptor positive (HCC) C50.412    Z17.0   2. Ductal carcinoma in situ (DCIS) of left breast D05.12    Stage 0 [id number] multifocal Left Breast UOQ Ductal Carcinoma In Situ with calcifications, ER 95-100% / PR 2-30%, Intermediate grade.  CHIEF COMPLAINT: Here to discuss management of left breast DCIS.  Narrative:  The patient returns today for follow-up, she was previously seen on 01/30/18 for an initial consultation.   Since consultation, she underwent a Left breast lumpectomy on 03/22/18 which demonstrated multifocal ductal carcinoma in situ, multifocal, fibrocystic changed with calfications and no invasive carcinoma.     Margins <1mm posteriorly and 1mm laterally and superiorly.   DCIS foci measured 1.5, 1.0 and 0.3cm. No necrosis.   She is healing well.  She is working as a social worker.  She denies any issues with arm mobility or lymphedema  ALLERGIES:  has No Known Allergies.  Meds: Current Outpatient Medications  Medication Sig Dispense Refill  . aspirin 81 MG chewable tablet Chew 81 mg by mouth daily.    . ezetimibe (ZETIA) 10 MG tablet   4  . hydrochlorothiazide (HYDRODIURIL) 25 MG tablet Take 25 mg by mouth daily.    . HYDROcodone-acetaminophen (NORCO/VICODIN) 5-325 MG tablet Take 1 tablet by mouth every 6 (six) hours as needed for moderate pain. Prescribed by Dr. Carla Smith with [facility].    . lisinopril (PRINIVIL,ZESTRIL) 10 MG tablet Take 10 mg by mouth daily.     No current facility-administered medications for this encounter.     Physical Findings:  height is 5' 7" (1.702 m) and weight is 137 lb 9.6 oz (62.4 kg). Her temperature is 98.6  F (37 C). Her blood pressure is 149/77 (abnormal) and her pulse is 66. Her respiration is 16 and oxygen saturation is 100%. Breast exam reveals Left breast lumpectomy scar is healing well with no significant swelling.  Lab Findings: Lab Results  Component Value Date   WBC 3.7 (L) 03/10/2009   HGB 11.1 (L) 03/10/2009   HCT 33.1 (L) 03/10/2009   MCV 90.1 03/10/2009   PLT 184.0 03/10/2009     Radiographic Findings: Mm Breast Surgical Specimen  Result Date: 03/22/2018 CLINICAL DATA:  Patient status post left breast lumpectomy. EXAM: SPECIMEN RADIOGRAPH OF THE LEFT BREAST COMPARISON:  Previous exam(s). FINDINGS: Status post excision of the left breast. The radioactive seed and biopsy marker clips are present, completely intact, and were marked for pathology. IMPRESSION: Specimen radiograph of the left breast. Electronically Signed   By: Drew  Davis M.D.   On: 03/22/2018 08:31   Mm Lt Radioactive Seed Loc Mammo Guide  Result Date: 03/21/2018 CLINICAL DATA:  52-year-old patient with recent diagnosis of ductal carcinoma in situ of the upper-outer quadrant of the left breast, diagnosed at 2 separate sites, presents for bracketing of the biopsied region. This report describes the localization of the coil shaped biopsy clip in the anterior third of the upper outer left breast. EXAM: MAMMOGRAPHIC GUIDED RADIOACTIVE SEED LOCALIZATION OF THE LEFT BREAST COMPARISON:  Previous exam(s). FINDINGS: Patient presents for radioactive seed localization prior to lumpectomy. I met with the patient and we   discussed the procedure of seed localization including benefits and alternatives. We discussed the high likelihood of a successful procedure. We discussed the risks of the procedure including infection, bleeding, tissue injury and further surgery. We discussed the low dose of radioactivity involved in the procedure. Informed, written consent was given. The usual time-out protocol was performed immediately prior to the  procedure. Using mammographic guidance, sterile technique, 1% lidocaine and an I-125 radioactive seed, a coil shaped biopsy clip was localized using a lateral approach. The follow-up mammogram images confirm the seed in the expected location and were marked for Dr. Brantley Stage. Follow-up survey of the patient confirms presence of the radioactive seed. Order number of I-125 seed:  [id number]. Total activity:  0.248 mCi reference Date: 08 March 2018 The patient tolerated the procedure well and was released from the [place]. She was given instructions regarding seed removal. IMPRESSION: Radioactive seed localization left breast. No apparent complications. Electronically Signed   By: Curlene Dolphin M.D.   On: 03/21/2018 14:21   Mm Lt Rad Seed Ea Add Lesion Loc Mammo  Result Date: 03/21/2018 CLINICAL DATA:  52 year old patient with recent diagnosis of ductal carcinoma in situ of the upper-outer quadrant of the left breast, diagnosed at 2 separate sites, presents for bracketing of the biopsied region. This report describes the localization of the X shaped biopsy clip in the posterior third of the upper-outer quadrant of the left breast. EXAM: MAMMOGRAPHIC GUIDED RADIOACTIVE SEED LOCALIZATION OF THE LEFT BREAST COMPARISON:  Previous exam(s). FINDINGS: Patient presents for radioactive seed localization prior to lumpectomy. I met with the patient and we discussed the procedure of seed localization including benefits and alternatives. We discussed the high likelihood of a successful procedure. We discussed the risks of the procedure including infection, bleeding, tissue injury and further surgery. We discussed the low dose of radioactivity involved in the procedure. Informed, written consent was given. The usual time-out protocol was performed immediately prior to the procedure. Using mammographic guidance, sterile technique, 1% lidocaine and an I-125 radioactive seed, an X shaped biopsy clip was localized using a lateral  approach. The follow-up mammogram images confirm the seed in the expected location and were marked for Dr. Brantley Stage. Follow-up survey of the patient confirms presence of the radioactive seed. Order number of I-125 seed:  [id number]. Total activity: 0.248 mCi reference Date: 08 March 2018 The patient tolerated the procedure well and was released from the [place]. She was given instructions regarding seed removal. IMPRESSION: Radioactive seed localization left breast. No apparent complications. Electronically Signed   By: Curlene Dolphin M.D.   On: 03/21/2018 14:19    Impression/Plan: Left breast DCIS, ER +, close margin multifocally. She will be discussed at tumor board on 04/11/18, I explained to her that she may benefit from a MRI or mammogram to rule out any other obvious sites of DCIS or consideration of re-exisicion of the margins to ensure that they are clear. Will discuss the pros an cons of further procedures. Regardless,she will tentatively benefit from radiotherapy of the left breast over a 4 week span to reduce the risk of local reoccurrences by half. I talked to her about the logistics of radiation therapy, and will let her know that we will plan it, as soon as we know that the margins are clear.  I sent a message to Dr. Brantley Stage to let him know about my thoughts above; she sees him on April 29  We discussed adjuvant radiotherapy today.  I recommend radiotherapy over 4  weeks to the left breast in order to to improve chance of local control.  The risks, benefits and side effects of this treatment were discussed in detail.  She understands that radiotherapy is associated with skin irritation and fatigue in the acute setting. Late effects can include cosmetic changes and rare injury to internal organs.   She is enthusiastic about proceeding with treatment. A consent form has been  signed and placed in her chart.  A total of 3 medically necessary complex treatment devices will be fabricated and  supervised by me: 2 fields with MLCs for custom blocks to protect heart, and lungs;  and, a Vac-lok. MORE COMPLEX DEVICES MAY BE MADE IN DOSIMETRY FOR FIELD IN FIELD BEAMS FOR DOSE HOMOGENEITY.  I have requested : 3D Simulation which is medically necessary to give adequate dose to at risk tissues while sparing lungs and heart.  I have requested a DVH of the following structures: lungs, heart, left lumpectomy cavity.    The patient will receive 40.05 Gy in 15 fractions to the left breast with 2 fields.  This will be followed by a boost.  I spent at least 15 minutes face to face with the patient and more than 50% of that time was spent in counseling and/or coordination of care. _____________________________________   Sarah Squire, MD This document serves as a record of services personally performed by Sarah Squire MD. It was created on her behalf by Ashley Banegas, a trained medical scribe. The creation of this record is based on the scribe's personal observations and the provider's statements to them.   

## 2018-04-10 ENCOUNTER — Other Ambulatory Visit: Payer: Self-pay | Admitting: Radiation Oncology

## 2018-04-10 ENCOUNTER — Telehealth: Payer: Self-pay | Admitting: *Deleted

## 2018-04-10 DIAGNOSIS — D0512 Intraductal carcinoma in situ of left breast: Secondary | ICD-10-CM

## 2018-04-10 NOTE — Progress Notes (Signed)
I called the patient after conferring with Dr Brantley Stage.  He states that deep margin is at pectoralis, The other margin is 3 mm, The only close margin is lateral and all are focal. No more room for re excision given breast size.  We will still talk about her at tumor board but tentatively the plan is to proceed with a mammogram and then with radiation planning - I will order these appts.  I let Deanna Roberts know and she is pleased w/ this plan.  -----------------------------------  Eppie Gibson, MD

## 2018-04-10 NOTE — Telephone Encounter (Signed)
CALLED PATIENT TO INFORM OF MAMMOGRAM ON 05-01-18 - ARRIVAL TIME - 10 AM [facility], LVM FOR A RETURN CALL

## 2018-05-04 ENCOUNTER — Ambulatory Visit
Admission: RE | Admit: 2018-05-04 | Discharge: 2018-05-04 | Disposition: A | Discharge status: Home / Self Care | Payer: BLUE CROSS/BLUE SHIELD | Source: Ambulatory Visit | Attending: Radiation Oncology | Admitting: Radiation Oncology

## 2018-05-04 DIAGNOSIS — D0512 Intraductal carcinoma in situ of left breast: Secondary | ICD-10-CM

## 2018-05-08 ENCOUNTER — Ambulatory Visit: Payer: BLUE CROSS/BLUE SHIELD | Attending: Radiation Oncology | Admitting: Radiation Oncology

## 2018-05-10 ENCOUNTER — Telehealth: Payer: Self-pay | Admitting: Hematology

## 2018-05-10 NOTE — Telephone Encounter (Signed)
Appointment scheduled and patient notified letter/calendar mailed to patient per 5/29 sch msg

## 2018-05-25 ENCOUNTER — Ambulatory Visit
Admission: RE | Admit: 2018-05-25 | Discharge: 2018-05-25 | Disposition: A | Discharge status: Home / Self Care | Payer: BLUE CROSS/BLUE SHIELD | Source: Ambulatory Visit | Attending: Radiation Oncology | Admitting: Radiation Oncology

## 2018-05-25 DIAGNOSIS — D0512 Intraductal carcinoma in situ of left breast: Secondary | ICD-10-CM

## 2018-05-25 DIAGNOSIS — Z510 Encounter for antineoplastic radiation therapy: Secondary | ICD-10-CM | POA: Diagnosis present

## 2018-05-25 DIAGNOSIS — Z170 Estrogen receptor positive status [ER+]: Secondary | ICD-10-CM | POA: Insufficient documentation

## 2018-05-25 DIAGNOSIS — C50412 Malignant neoplasm of upper-outer quadrant of left female breast: Secondary | ICD-10-CM | POA: Insufficient documentation

## 2018-05-25 NOTE — Progress Notes (Signed)
Radiation Oncology         (336) [phone] ________________________________  Name: Deanna Roberts MRN: [id number]  Date: 05/25/2018  DOB: 07-Feb-1966  SIMULATION AND TREATMENT PLANNING NOTE / Special treatment procedure:    Outpatient  DIAGNOSIS:     ICD-10-CM   1. Carcinoma of upper-outer quadrant of left breast in female, estrogen receptor positive ([place]) C50.412    Z17.0   2. Ductal carcinoma in situ (DCIS) of left breast D05.12     NARRATIVE:  The patient was brought to the [facility]. She was unable to attend a previous simulation and today was the soonest she could reschedule. We will expedite her plan.  Mammogram last month was BIRADS 2. Identity was confirmed.  All relevant records and images related to the planned course of therapy were reviewed.  The patient freely provided informed written consent to proceed with treatment after reviewing the details related to the planned course of therapy. The consent form was witnessed and verified by the simulation staff.    Then, the patient was set-up in a stable reproducible supine position for radiation therapy with her ipsilateral arm over her head, and her upper body secured in a custom-made Vac-lok device.  CT images were obtained.  Surface markings were placed.  The CT images were loaded into the planning software.    Special treatment procedure:  Special treatment procedure was performed today due to the extra time and effort required by myself to plan and prepare this patient for deep inspiration breath hold technique.  I have determined cardiac sparing to be of benefit to this patient to prevent long term cardiac damage due to radiation of the heart.  Bellows were placed on the patient's abdomen. To facilitate cardiac sparing, the patient was coached by the radiation therapists on breath hold techniques and breathing practice was performed. Practice waveforms were obtained. The patient was then scanned while maintaining  breath hold in the treatment position.  This image was then transferred over to the imaging specialist. The imaging specialist then created a fusion of the free breathing and breath hold scans using the chest wall as the stable structure. I personally reviewed the fusion in axial, coronal and sagittal image planes.  Excellent cardiac sparing was obtained.  I felt the patient is an appropriate candidate for breath hold and the patient will be treated as such.  The image fusion was then reviewed with the patient to reinforce the necessity of reproducible breath hold.  TREATMENT PLANNING NOTE: Treatment planning then occurred.  The radiation prescription was entered and confirmed.     A total of 3 medically necessary complex treatment devices were fabricated and supervised by me: 2 fields with MLCs for custom blocks to protect heart, and lungs;  and, a Vac-lok. MORE COMPLEX DEVICES MAY BE MADE IN DOSIMETRY FOR FIELD IN FIELD BEAMS FOR DOSE HOMOGENEITY.  I have requested : 3D Simulation which is medically necessary to give adequate dose to at risk tissues while sparing lungs and heart.  I have requested a DVH of the following structures: lungs, heart, left lumpectomy cavity.    The patient will receive 40.05 Gy in 15 fractions to the left breast with 2 tangential fields.  This will be followed by a boost.  Optical Surface Tracking Plan:  Since intensity modulated radiotherapy (IMRT) and 3D conformal radiation treatment methods are predicated on accurate and precise positioning for treatment, intrafraction motion monitoring is medically necessary to ensure accurate and safe treatment delivery. The  ability to quantify intrafraction motion without excessive ionizing radiation dose can only be performed with optical surface tracking. Accordingly, surface imaging offers the opportunity to obtain 3D measurements of patient position throughout IMRT and 3D treatments without excessive radiation exposure. I am ordering  optical surface tracking for this patient's upcoming course of radiotherapy.  ________________________________   Reference:  Ursula Alert, J, et al. Surface imaging-based analysis of intrafraction motion for breast radiotherapy patients.Journal of [place], n. 6, nov. 2014. ISSN [id number].  Available at: <http://www.jacmp.org/index.php/jacmp/article/view/4957>.    -----------------------------------  Eppie Gibson, MD

## 2018-05-30 ENCOUNTER — Ambulatory Visit
Admission: RE | Admit: 2018-05-30 | Discharge: 2018-05-30 | Disposition: A | Discharge status: Home / Self Care | Payer: BLUE CROSS/BLUE SHIELD | Source: Ambulatory Visit | Attending: Radiation Oncology | Admitting: Radiation Oncology

## 2018-05-30 DIAGNOSIS — Z510 Encounter for antineoplastic radiation therapy: Secondary | ICD-10-CM | POA: Diagnosis not present

## 2018-05-31 ENCOUNTER — Ambulatory Visit
Admission: RE | Admit: 2018-05-31 | Discharge: 2018-05-31 | Disposition: A | Discharge status: Home / Self Care | Payer: BLUE CROSS/BLUE SHIELD | Source: Ambulatory Visit | Attending: Radiation Oncology | Admitting: Radiation Oncology

## 2018-05-31 DIAGNOSIS — Z510 Encounter for antineoplastic radiation therapy: Secondary | ICD-10-CM | POA: Diagnosis not present

## 2018-06-01 ENCOUNTER — Ambulatory Visit: Payer: BLUE CROSS/BLUE SHIELD

## 2018-06-04 ENCOUNTER — Ambulatory Visit
Admission: RE | Admit: 2018-06-04 | Discharge: 2018-06-04 | Disposition: A | Discharge status: Home / Self Care | Payer: BLUE CROSS/BLUE SHIELD | Source: Ambulatory Visit | Attending: Radiation Oncology | Admitting: Radiation Oncology

## 2018-06-04 ENCOUNTER — Telehealth: Payer: Self-pay | Admitting: *Deleted

## 2018-06-04 DIAGNOSIS — D0512 Intraductal carcinoma in situ of left breast: Secondary | ICD-10-CM

## 2018-06-04 DIAGNOSIS — Z510 Encounter for antineoplastic radiation therapy: Secondary | ICD-10-CM | POA: Diagnosis not present

## 2018-06-04 MED ORDER — RADIAPLEXRX EX GEL
Freq: Once | CUTANEOUS | Status: AC
  Administered 2018-06-04: 17:00:00 via TOPICAL

## 2018-06-04 MED ORDER — ALRA NON-METALLIC DEODORANT (RAD-ONC): 1.0000 "application " | Freq: Once | TOPICAL | Status: DC

## 2018-06-04 NOTE — Progress Notes (Signed)
Pt here for patient teaching.  Pt given Radiation and You booklet, skin care instructions and Radiaplex gel.  Reviewed areas of pertinence such as fatigue, skin changes, breast tenderness and breast swelling . Pt able to give teach back of to pat skin, use unscented/gentle soap and drink plenty of water,apply Radiaplex bid, avoid applying anything to skin within 4 hours of treatment, avoid wearing an under wire bra and to use an electric razor if they must shave. Pt verbalizes understanding of information given and will contact nursing with any questions or concerns.     Http://rtanswers.org/treatmentinformation/whattoexpect/index      

## 2018-06-04 NOTE — Telephone Encounter (Signed)
CALLED PATIENT TO ASK IF SHE CAN COME IN EARLY TO SEE DR. SQUIRE TODAY PRIOR TO TREATMENT, LVM FOR A RETURN CALL

## 2018-06-05 ENCOUNTER — Ambulatory Visit
Admission: RE | Admit: 2018-06-05 | Discharge: 2018-06-05 | Disposition: A | Discharge status: Home / Self Care | Payer: BLUE CROSS/BLUE SHIELD | Source: Ambulatory Visit | Attending: Radiation Oncology | Admitting: Radiation Oncology

## 2018-06-05 DIAGNOSIS — Z510 Encounter for antineoplastic radiation therapy: Secondary | ICD-10-CM | POA: Diagnosis not present

## 2018-06-06 ENCOUNTER — Ambulatory Visit
Admission: RE | Admit: 2018-06-06 | Discharge: 2018-06-06 | Disposition: A | Discharge status: Home / Self Care | Payer: BLUE CROSS/BLUE SHIELD | Source: Ambulatory Visit | Attending: Radiation Oncology | Admitting: Radiation Oncology

## 2018-06-06 DIAGNOSIS — Z510 Encounter for antineoplastic radiation therapy: Secondary | ICD-10-CM | POA: Diagnosis not present

## 2018-06-07 ENCOUNTER — Ambulatory Visit
Admission: RE | Admit: 2018-06-07 | Discharge: 2018-06-07 | Disposition: A | Discharge status: Home / Self Care | Payer: BLUE CROSS/BLUE SHIELD | Source: Ambulatory Visit | Attending: Radiation Oncology | Admitting: Radiation Oncology

## 2018-06-07 DIAGNOSIS — Z510 Encounter for antineoplastic radiation therapy: Secondary | ICD-10-CM | POA: Diagnosis not present

## 2018-06-08 ENCOUNTER — Ambulatory Visit: Payer: BLUE CROSS/BLUE SHIELD

## 2018-06-11 ENCOUNTER — Emergency Department (HOSPITAL_BASED_OUTPATIENT_CLINIC_OR_DEPARTMENT_OTHER)
Admission: EM | Admit: 2018-06-11 | Discharge: 2018-06-11 | Disposition: A | Discharge status: Home / Self Care | Payer: BLUE CROSS/BLUE SHIELD | Attending: Emergency Medicine | Admitting: Emergency Medicine

## 2018-06-11 ENCOUNTER — Encounter (HOSPITAL_BASED_OUTPATIENT_CLINIC_OR_DEPARTMENT_OTHER): Payer: Self-pay | Admitting: *Deleted

## 2018-06-11 ENCOUNTER — Ambulatory Visit
Admission: RE | Admit: 2018-06-11 | Discharge: 2018-06-11 | Disposition: A | Discharge status: Home / Self Care | Payer: BLUE CROSS/BLUE SHIELD | Source: Ambulatory Visit | Attending: Radiation Oncology | Admitting: Radiation Oncology

## 2018-06-11 ENCOUNTER — Other Ambulatory Visit: Payer: Self-pay

## 2018-06-11 DIAGNOSIS — N760 Acute vaginitis: Secondary | ICD-10-CM | POA: Diagnosis not present

## 2018-06-11 DIAGNOSIS — I10 Essential (primary) hypertension: Secondary | ICD-10-CM | POA: Diagnosis not present

## 2018-06-11 DIAGNOSIS — B9689 Other specified bacterial agents as the cause of diseases classified elsewhere: Secondary | ICD-10-CM

## 2018-06-11 DIAGNOSIS — J029 Acute pharyngitis, unspecified: Secondary | ICD-10-CM | POA: Diagnosis not present

## 2018-06-11 DIAGNOSIS — Z79899 Other long term (current) drug therapy: Secondary | ICD-10-CM | POA: Diagnosis not present

## 2018-06-11 DIAGNOSIS — C50412 Malignant neoplasm of upper-outer quadrant of left female breast: Secondary | ICD-10-CM | POA: Insufficient documentation

## 2018-06-11 DIAGNOSIS — E119 Type 2 diabetes mellitus without complications: Secondary | ICD-10-CM | POA: Diagnosis not present

## 2018-06-11 DIAGNOSIS — I2510 Atherosclerotic heart disease of native coronary artery without angina pectoris: Secondary | ICD-10-CM | POA: Diagnosis not present

## 2018-06-11 DIAGNOSIS — D0512 Intraductal carcinoma in situ of left breast: Secondary | ICD-10-CM | POA: Diagnosis present

## 2018-06-11 DIAGNOSIS — Z7982 Long term (current) use of aspirin: Secondary | ICD-10-CM | POA: Insufficient documentation

## 2018-06-11 DIAGNOSIS — N898 Other specified noninflammatory disorders of vagina: Secondary | ICD-10-CM | POA: Diagnosis present

## 2018-06-11 DIAGNOSIS — Z510 Encounter for antineoplastic radiation therapy: Secondary | ICD-10-CM | POA: Diagnosis not present

## 2018-06-11 DIAGNOSIS — I252 Old myocardial infarction: Secondary | ICD-10-CM | POA: Insufficient documentation

## 2018-06-11 DIAGNOSIS — Z170 Estrogen receptor positive status [ER+]: Secondary | ICD-10-CM | POA: Insufficient documentation

## 2018-06-11 HISTORY — DX: Malignant (primary) neoplasm, unspecified: C80.1

## 2018-06-11 LAB — URINALYSIS, ROUTINE W REFLEX MICROSCOPIC
Bilirubin Urine: NEGATIVE
Glucose, UA: NEGATIVE mg/dL
Ketones, ur: NEGATIVE mg/dL
Nitrite: NEGATIVE
Protein, ur: NEGATIVE mg/dL
Specific Gravity, Urine: 1.02 (ref 1.005–1.030)
pH: 6 (ref 5.0–8.0)

## 2018-06-11 LAB — URINALYSIS, MICROSCOPIC (REFLEX): WBC, UA: >50 WBC/hpf (ref 0–5)

## 2018-06-11 LAB — WET PREP, GENITAL
Sperm: NONE SEEN
Trich, Wet Prep: NONE SEEN
Yeast Wet Prep HPF POC: NONE SEEN

## 2018-06-11 LAB — RAPID STREP SCREEN (MED CTR MEBANE ONLY): Streptococcus, Group A Screen (Direct): NEGATIVE

## 2018-06-11 MED ORDER — AZITHROMYCIN 250 MG PO TABS
1000.0000 mg | ORAL_TABLET | Freq: Once | ORAL | Status: AC
  Administered 2018-06-11: 1000 mg via ORAL
  Filled 2018-06-11: qty 4

## 2018-06-11 MED ORDER — LIDOCAINE HCL (PF) 1 % IJ SOLN
INTRAMUSCULAR | Status: AC
  Administered 2018-06-11: 0.9 mL
  Filled 2018-06-11: qty 5

## 2018-06-11 MED ORDER — OXYCODONE-ACETAMINOPHEN 5-325 MG PO TABS
1.0000 | ORAL_TABLET | Freq: Once | ORAL | Status: DC
  Filled 2018-06-11: qty 1

## 2018-06-11 MED ORDER — IBUPROFEN 400 MG PO TABS
600.0000 mg | ORAL_TABLET | Freq: Once | ORAL | Status: AC
  Administered 2018-06-11: 22:00:00 600 mg via ORAL
  Filled 2018-06-11: qty 1

## 2018-06-11 MED ORDER — CEFTRIAXONE SODIUM 250 MG IJ SOLR
250.0000 mg | Freq: Once | INTRAMUSCULAR | Status: AC
  Administered 2018-06-11: 250 mg via INTRAMUSCULAR
  Filled 2018-06-11: qty 250

## 2018-06-11 MED ORDER — METRONIDAZOLE 500 MG PO TABS: 500.0000 mg | ORAL_TABLET | Freq: Two times a day (BID) | ORAL | 0 refills | Status: DC

## 2018-06-11 NOTE — ED Triage Notes (Signed)
Vaginal discharge. States she has a boil on her vagina. She has a sore throat she wants to be seen for.

## 2018-06-12 ENCOUNTER — Ambulatory Visit
Admission: RE | Admit: 2018-06-12 | Discharge: 2018-06-12 | Disposition: A | Discharge status: Home / Self Care | Payer: BLUE CROSS/BLUE SHIELD | Source: Ambulatory Visit | Attending: Radiation Oncology | Admitting: Radiation Oncology

## 2018-06-12 DIAGNOSIS — Z510 Encounter for antineoplastic radiation therapy: Secondary | ICD-10-CM | POA: Diagnosis not present

## 2018-06-13 ENCOUNTER — Ambulatory Visit
Admission: RE | Admit: 2018-06-13 | Discharge: 2018-06-13 | Disposition: A | Discharge status: Home / Self Care | Payer: BLUE CROSS/BLUE SHIELD | Source: Ambulatory Visit | Attending: Radiation Oncology | Admitting: Radiation Oncology

## 2018-06-13 DIAGNOSIS — Z510 Encounter for antineoplastic radiation therapy: Secondary | ICD-10-CM | POA: Diagnosis not present

## 2018-06-13 LAB — CERVICOVAGINAL ANCILLARY ONLY
Chlamydia: NEGATIVE
NEISSERIA GONORRHEA: NEGATIVE

## 2018-06-14 LAB — CULTURE, GROUP A STREP (THRC)

## 2018-06-15 ENCOUNTER — Ambulatory Visit: Payer: BLUE CROSS/BLUE SHIELD

## 2018-06-15 DIAGNOSIS — Z510 Encounter for antineoplastic radiation therapy: Secondary | ICD-10-CM | POA: Diagnosis not present

## 2018-06-18 ENCOUNTER — Ambulatory Visit
Admission: RE | Admit: 2018-06-18 | Discharge: 2018-06-18 | Disposition: A | Discharge status: Home / Self Care | Payer: BLUE CROSS/BLUE SHIELD | Source: Ambulatory Visit | Attending: Radiation Oncology | Admitting: Radiation Oncology

## 2018-06-18 ENCOUNTER — Ambulatory Visit: Payer: BLUE CROSS/BLUE SHIELD | Admitting: Radiation Oncology

## 2018-06-18 DIAGNOSIS — Z510 Encounter for antineoplastic radiation therapy: Secondary | ICD-10-CM | POA: Diagnosis not present

## 2018-06-18 NOTE — ED Provider Notes (Signed)
[facility] EMERGENCY DEPARTMENT Provider Note   CSN: [id number] Arrival date & time: 06/11/18  2048     History   Chief Complaint Chief Complaint  Patient presents with  . Sore Throat  . Vaginal Discharge    HPI Deanna Roberts is a 52 y.o. female.  HPI   65yF with vaginal discharge. Onset two days ago. Mild vaginal discomfort. Is sexually active. No abdominal pain. No urinary complaints. Additionally, c/o sore throat. This began about a week ago. Persistent since. No fever. No cough. No cp.   Past Medical History:  Diagnosis Date  . Cancer ([place])   . Coronary artery disease   . Diabetes mellitus without complication ([place])   . Hypertension   . Myocardial infarction [facility]) 2013    Patient Active Problem List   Diagnosis Date Noted  . Carcinoma of upper-outer quadrant of left breast in female, estrogen receptor positive ([place]) 01/30/2018  . 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 FEVER UNSPECIFIED 03/17/2009  . ANEMIA-IRON DEFICIENCY 10/08/2007  . SICKLE CELL TRAIT 10/08/2007  . DISEASE, SICKLE CELL W/CRISIS NEC 10/08/2007  . HYPERTENSION 10/08/2007  . GLUCOSE INTOLERANCE, HX OF 10/08/2007    Past Surgical History:  Procedure Laterality Date  . ABDOMINAL HYSTERECTOMY    . BREAST LUMPECTOMY WITH RADIOACTIVE SEED LOCALIZATION Left 03/22/2018   Procedure: LEFT BREAST LUMPECTOMY WITH BRACKETED RADIOACTIVE SEED LOCALIZATION;  Surgeon: Erroll Luna, MD;  Location: [facility];  Service: General;  Laterality: Left;     OB History   None      Home Medications    Prior to Admission medications   Medication Sig Start Date End Date Taking? Authorizing Provider  aspirin 81 MG chewable tablet Chew 81 mg by mouth daily.    [provider]  ezetimibe (ZETIA) 10 MG tablet  01/25/18   [provider]  hydrochlorothiazide (HYDRODIURIL) 25 MG tablet Take 25 mg by mouth daily.    [provider]    HYDROcodone-acetaminophen (NORCO/VICODIN) 5-325 MG tablet Take 1 tablet by mouth every 6 (six) hours as needed for moderate pain. Prescribed by Dr. Clayborne Dana with [facility].    [provider]  lisinopril (PRINIVIL,ZESTRIL) 10 MG tablet Take 10 mg by mouth daily.    [provider]  metroNIDAZOLE (FLAGYL) 500 MG tablet Take 1 tablet (500 mg total) by mouth 2 (two) times daily. 06/11/18   Virgel Manifold, MD    Family History Family History  Problem Relation Age of Onset  . Heart attack Mother   . Breast cancer Mother 61  . Multiple sclerosis Sister   . Multiple sclerosis Sister     Social History Social History   Tobacco Use  . Smoking status: Never Smoker  . Smokeless tobacco: Never Used  . Tobacco comment: she smoked in college  Substance Use Topics  . Alcohol use: Yes    Comment: occasional  . Drug use: No     Allergies   Patient has no known allergies.   Review of Systems Review of Systems  All systems reviewed and negative, other than as noted in HPI.  Physical Exam Updated Vital Signs BP 138/89 (BP Location: Right Arm)   Pulse 82   Temp 98.8 F (37.1 C) (Oral)   Resp 16   Ht 5\' 7"  (1.702 m)   Wt 63.5 kg (140 lb)   SpO2 100%   BMI 21.93 kg/m   Physical Exam  Constitutional: She appears well-developed  and well-nourished. No distress.  HENT:  Head: Normocephalic and atraumatic.  Mild pharyngitis w/o exudate. Midline uvula. Neck supple. Normal sounding voice. No trismus. Submental tissues soft.   Eyes: Conjunctivae are normal. Right eye exhibits no discharge. Left eye exhibits no discharge.  Neck: Neck supple.  Cardiovascular: Normal rate, regular rhythm and normal heart sounds. Exam reveals no gallop and no friction rub.  No murmur heard. Pulmonary/Chest: Effort normal and breath sounds normal. No respiratory distress.  Abdominal: Soft. She exhibits no distension. There is no tenderness.  Genitourinary:  Genitourinary  Comments: Chaperone present. Normal external female genitalia. Whitish/grey discharge. Cervix w/o lesions.   Musculoskeletal: She exhibits no edema or tenderness.  Neurological: She is alert.  Skin: Skin is warm and dry.  Psychiatric: She has a normal mood and affect. Her behavior is normal. Thought content normal.  Nursing note and vitals reviewed.    ED Treatments / Results  Labs (all labs ordered are listed, but only abnormal results are displayed) Labs Reviewed  WET PREP, GENITAL - Abnormal; Notable for the following components:      Result Value   Clue Cells Wet Prep HPF POC PRESENT (*)    WBC, Wet Prep HPF POC MANY (*)    All other components within normal limits  URINALYSIS, ROUTINE W REFLEX MICROSCOPIC - Abnormal; Notable for the following components:   APPearance CLOUDY (*)    Hgb urine dipstick TRACE (*)    Leukocytes, UA LARGE (*)    All other components within normal limits  URINALYSIS, MICROSCOPIC (REFLEX) - Abnormal; Notable for the following components:   Bacteria, UA FEW (*)    All other components within normal limits  RAPID STREP SCREEN ([facility] & [facility] ONLY)  CULTURE, GROUP A STREP [facility])  CERVICOVAGINAL ANCILLARY ONLY    EKG None  Radiology No results found.  Procedures Procedures (including critical care time)  Medications Ordered in ED Medications  ibuprofen (ADVIL,MOTRIN) tablet 600 mg (600 mg Oral Given 06/11/18 2157)  cefTRIAXone (ROCEPHIN) injection 250 mg (250 mg Intramuscular Given 06/11/18 2159)  azithromycin (ZITHROMAX) tablet 1,000 mg (1,000 mg Oral Given 06/11/18 2159)  lidocaine (PF) (XYLOCAINE) 1 % injection (0.9 mLs  Given 06/11/18 2159)     Initial Impression / Assessment and Plan / ED Course  I have reviewed the triage vital signs and the nursing notes.  Pertinent labs & imaging results that were available during my care of the patient were reviewed by me and considered in my medical decision making (see chart for details).      Empirically tx'd for GC. Abx for BV. Minimal pharyngitis on exam. Low suspicion for serious deep space neck infection.   Final Clinical Impressions(s) / ED Diagnoses   Final diagnoses:  BV (bacterial vaginosis)    ED Discharge Orders        Ordered    metroNIDAZOLE (FLAGYL) 500 MG tablet  2 times daily     06/11/18 2319       Virgel Manifold, MD 06/18/18 1640

## 2018-06-19 ENCOUNTER — Ambulatory Visit: Payer: BLUE CROSS/BLUE SHIELD

## 2018-06-20 ENCOUNTER — Ambulatory Visit
Admission: RE | Admit: 2018-06-20 | Discharge: 2018-06-20 | Disposition: A | Discharge status: Home / Self Care | Payer: BLUE CROSS/BLUE SHIELD | Source: Ambulatory Visit | Attending: Radiation Oncology | Admitting: Radiation Oncology

## 2018-06-20 DIAGNOSIS — Z510 Encounter for antineoplastic radiation therapy: Secondary | ICD-10-CM | POA: Diagnosis not present

## 2018-06-21 ENCOUNTER — Ambulatory Visit
Admission: RE | Admit: 2018-06-21 | Discharge: 2018-06-21 | Disposition: A | Discharge status: Home / Self Care | Payer: BLUE CROSS/BLUE SHIELD | Source: Ambulatory Visit | Attending: Radiation Oncology | Admitting: Radiation Oncology

## 2018-06-21 DIAGNOSIS — Z510 Encounter for antineoplastic radiation therapy: Secondary | ICD-10-CM | POA: Diagnosis not present

## 2018-06-22 ENCOUNTER — Ambulatory Visit
Admission: RE | Admit: 2018-06-22 | Discharge: 2018-06-22 | Disposition: A | Discharge status: Home / Self Care | Payer: BLUE CROSS/BLUE SHIELD | Source: Ambulatory Visit | Attending: Radiation Oncology | Admitting: Radiation Oncology

## 2018-06-22 ENCOUNTER — Ambulatory Visit: Payer: BLUE CROSS/BLUE SHIELD

## 2018-06-22 DIAGNOSIS — Z510 Encounter for antineoplastic radiation therapy: Secondary | ICD-10-CM | POA: Diagnosis not present

## 2018-06-25 ENCOUNTER — Ambulatory Visit
Admission: RE | Admit: 2018-06-25 | Discharge: 2018-06-25 | Disposition: A | Discharge status: Home / Self Care | Payer: BLUE CROSS/BLUE SHIELD | Source: Ambulatory Visit | Attending: Radiation Oncology | Admitting: Radiation Oncology

## 2018-06-25 ENCOUNTER — Ambulatory Visit: Payer: BLUE CROSS/BLUE SHIELD | Admitting: Radiation Oncology

## 2018-06-25 ENCOUNTER — Ambulatory Visit: Payer: BLUE CROSS/BLUE SHIELD

## 2018-06-25 DIAGNOSIS — Z510 Encounter for antineoplastic radiation therapy: Secondary | ICD-10-CM | POA: Diagnosis not present

## 2018-06-26 ENCOUNTER — Ambulatory Visit
Admission: RE | Admit: 2018-06-26 | Discharge: 2018-06-26 | Disposition: A | Discharge status: Home / Self Care | Payer: BLUE CROSS/BLUE SHIELD | Source: Ambulatory Visit | Attending: Radiation Oncology | Admitting: Radiation Oncology

## 2018-06-26 ENCOUNTER — Ambulatory Visit: Payer: BLUE CROSS/BLUE SHIELD

## 2018-06-26 DIAGNOSIS — Z510 Encounter for antineoplastic radiation therapy: Secondary | ICD-10-CM | POA: Diagnosis not present

## 2018-06-27 ENCOUNTER — Ambulatory Visit
Admission: RE | Admit: 2018-06-27 | Discharge: 2018-06-27 | Disposition: A | Discharge status: Home / Self Care | Payer: BLUE CROSS/BLUE SHIELD | Source: Ambulatory Visit | Attending: Radiation Oncology | Admitting: Radiation Oncology

## 2018-06-27 ENCOUNTER — Ambulatory Visit: Payer: BLUE CROSS/BLUE SHIELD

## 2018-06-27 DIAGNOSIS — Z510 Encounter for antineoplastic radiation therapy: Secondary | ICD-10-CM | POA: Diagnosis not present

## 2018-06-28 ENCOUNTER — Ambulatory Visit: Payer: BLUE CROSS/BLUE SHIELD

## 2018-06-28 ENCOUNTER — Ambulatory Visit
Admission: RE | Admit: 2018-06-28 | Discharge: 2018-06-28 | Disposition: A | Discharge status: Home / Self Care | Payer: BLUE CROSS/BLUE SHIELD | Source: Ambulatory Visit | Attending: Radiation Oncology | Admitting: Radiation Oncology

## 2018-06-28 DIAGNOSIS — Z510 Encounter for antineoplastic radiation therapy: Secondary | ICD-10-CM | POA: Diagnosis not present

## 2018-06-29 ENCOUNTER — Ambulatory Visit: Payer: BLUE CROSS/BLUE SHIELD

## 2018-07-02 ENCOUNTER — Ambulatory Visit: Payer: BLUE CROSS/BLUE SHIELD

## 2018-07-02 ENCOUNTER — Ambulatory Visit
Admission: RE | Admit: 2018-07-02 | Discharge: 2018-07-02 | Disposition: A | Discharge status: Home / Self Care | Payer: BLUE CROSS/BLUE SHIELD | Source: Ambulatory Visit | Attending: Radiation Oncology | Admitting: Radiation Oncology

## 2018-07-02 DIAGNOSIS — Z510 Encounter for antineoplastic radiation therapy: Secondary | ICD-10-CM | POA: Diagnosis not present

## 2018-07-03 ENCOUNTER — Ambulatory Visit
Admission: RE | Admit: 2018-07-03 | Discharge: 2018-07-03 | Disposition: A | Discharge status: Home / Self Care | Payer: BLUE CROSS/BLUE SHIELD | Source: Ambulatory Visit | Attending: Radiation Oncology | Admitting: Radiation Oncology

## 2018-07-03 ENCOUNTER — Ambulatory Visit: Payer: BLUE CROSS/BLUE SHIELD

## 2018-07-03 ENCOUNTER — Ambulatory Visit: Payer: BLUE CROSS/BLUE SHIELD | Admitting: Hematology

## 2018-07-03 DIAGNOSIS — Z510 Encounter for antineoplastic radiation therapy: Secondary | ICD-10-CM | POA: Diagnosis not present

## 2018-07-04 ENCOUNTER — Ambulatory Visit
Admission: RE | Admit: 2018-07-04 | Discharge: 2018-07-04 | Disposition: A | Discharge status: Home / Self Care | Payer: BLUE CROSS/BLUE SHIELD | Source: Ambulatory Visit | Attending: Radiation Oncology | Admitting: Radiation Oncology

## 2018-07-04 DIAGNOSIS — Z510 Encounter for antineoplastic radiation therapy: Secondary | ICD-10-CM | POA: Diagnosis not present

## 2018-07-10 ENCOUNTER — Encounter: Payer: Self-pay | Admitting: Radiation Oncology

## 2018-07-10 NOTE — Progress Notes (Signed)
  Radiation Oncology         (336) [phone] ________________________________  Name: Deanna Roberts MRN: [id number]  Date: 07/10/2018  DOB: [birth year]  End of Treatment Note  Diagnosis:   Stage 0 Left Breast UOQ DCIS, ER+, PR+, Intermediate Grade Cancer Staging Ductal carcinoma in situ (DCIS) of left breast Staging form: Breast, AJCC 8th Edition - Clinical stage from 01/04/2018: Stage 0 (cTis (DCIS), cN0, cM0, ER: Positive, PR: Positive, HER2: Not assessed ) - Signed by Truitt Merle, MD on 01/26/2018 - Pathologic: Stage 0 (pTis (DCIS), pN0, cM0, ER+, PR+, HER2-) - Signed by Truitt Merle, MD on 07/19/2018   Indication for treatment:  Curative  Radiation treatment dates:  05/30/18 - 07/04/18  Site/dose:    1. Breast, Left/ total of 40.05 Gy in 15 fractions of 2.67 Gy 2. Boost, Breast, Left/ total of 10 Gy in 5 fractions of 2 Gy  Beams/energy:   1. 3D, Photon/ 6X 2. Electrons/ 6E   Narrative: The patient tolerated radiation treatment relatively well. She denied pain throughout treatment, but reported fatigue and hyperpigmentation.   Plan: The patient has completed radiation treatment. The patient will return to radiation oncology clinic for routine followup in one month. I advised them to call or return sooner if they have any questions or concerns related to their recovery or treatment.  -----------------------------------  Eppie Gibson, MD  This document serves as a record of services personally performed by Eppie Gibson, MD. It was created on his behalf by Wilburn Mylar, a trained medical scribe. The creation of this record is based on the scribe's personal observations and the provider's statements to them. This document has been checked and approved by the attending provider.

## 2018-07-12 ENCOUNTER — Telehealth: Payer: Self-pay | Admitting: Hematology

## 2018-07-12 NOTE — Telephone Encounter (Signed)
Tried to reach regarding voicemail and 8/8

## 2018-07-13 ENCOUNTER — Inpatient Hospital Stay: Payer: BLUE CROSS/BLUE SHIELD | Admitting: Hematology

## 2018-07-16 IMAGING — MG STEREOTACTIC CORE NEEDLE BIOPSY
8 of 11 series · 8 of 15 positions shown · non-contrast
Comparison: Previous exams.

ADDENDUM:
Pathology revealed INTERMEDIATE GRADE DUCTAL CARCINOMA IN SITU WITH
CALCIFICATIONS of the Left breast, upper outer quadrant at posterior
depth. This was found to be concordant by Dr. Edwige Kindred.

Pathology results were discussed with the patient by telephone. The
patient reported doing well after the biopsy with tenderness at the
site. Post biopsy instructions and care were reviewed and questions
were answered. The patient was encouraged to call The [REDACTED] for any additional concerns. The patient has a
recent diagnosis of left breast cancer and should follow her
outlined treatment plan. Pathology results were called to Dr. Rudi
Halmurat on January 05, 2018 at [DATE].
Pathology results reported by Fernando Mauricio Giraldo, RN on 01/05/2018.
CLINICAL DATA: 52-year-old with biopsy-proven intermediate grade
DCIS involving the upper outer quadrant of the left breast. The
suspicious segmental group of calcifications spans at least 7 cm.
She returns today for biopsy of the posterior extent of this group
of calcifications.
EXAM:
LEFT BREAST STEREOTACTIC CORE NEEDLE BIOPSY

[L (1 of 5)]
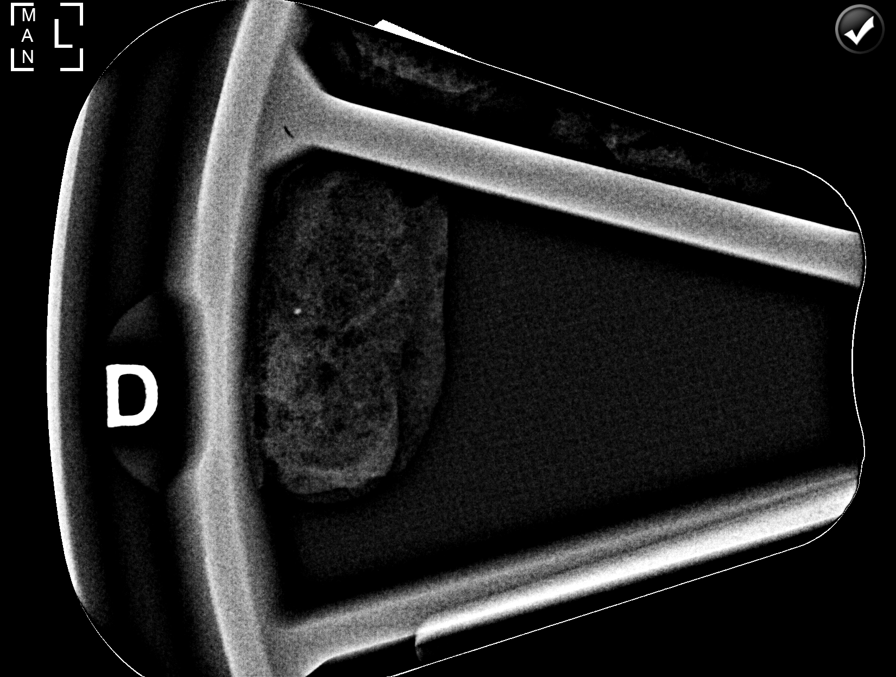

[L (2 of 5)]
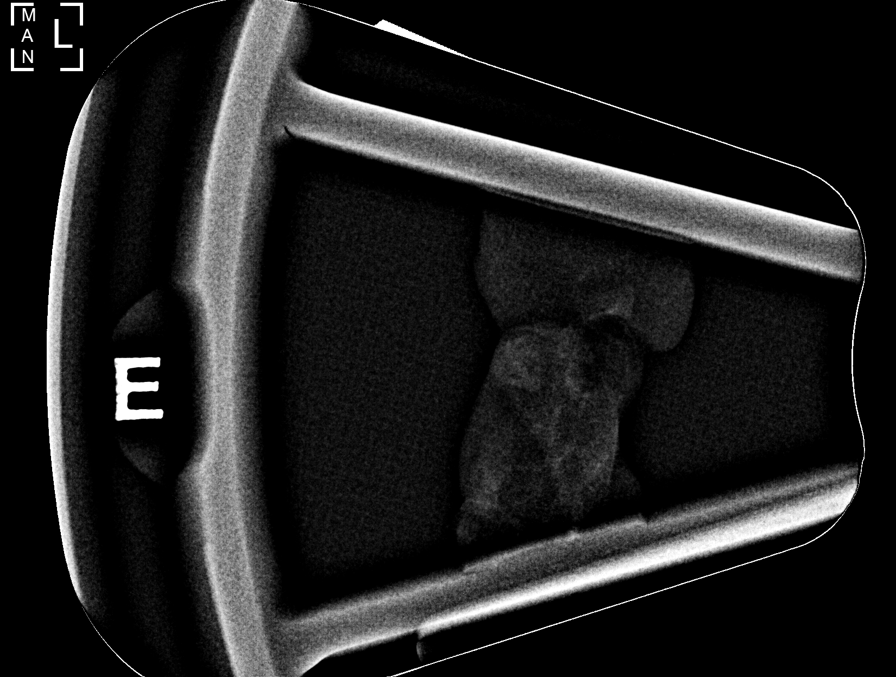

[L (3 of 5)]
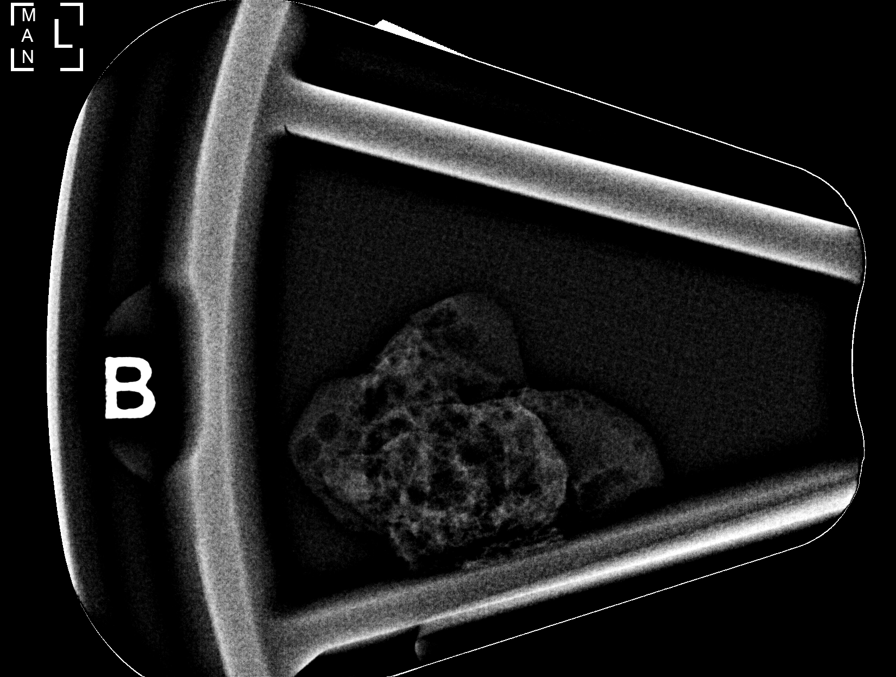

[L (4 of 5)]
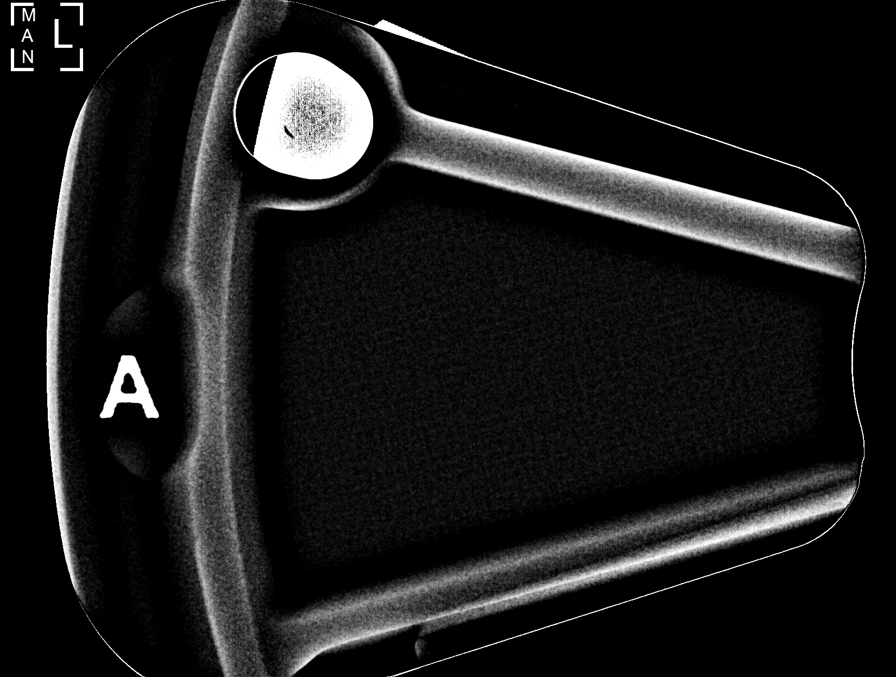

[L (5 of 5)]
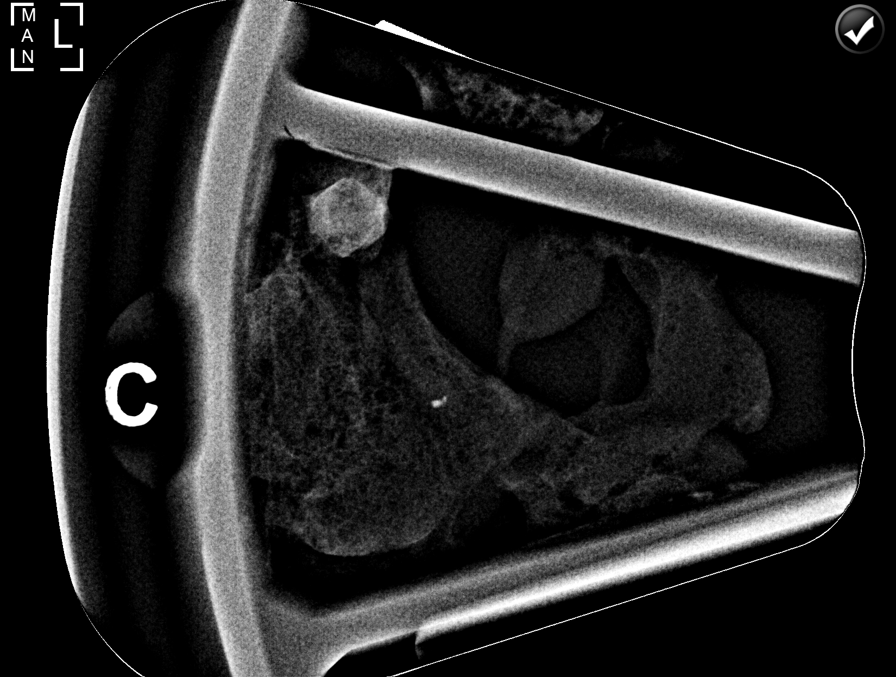

[L CC (1 of 3)]
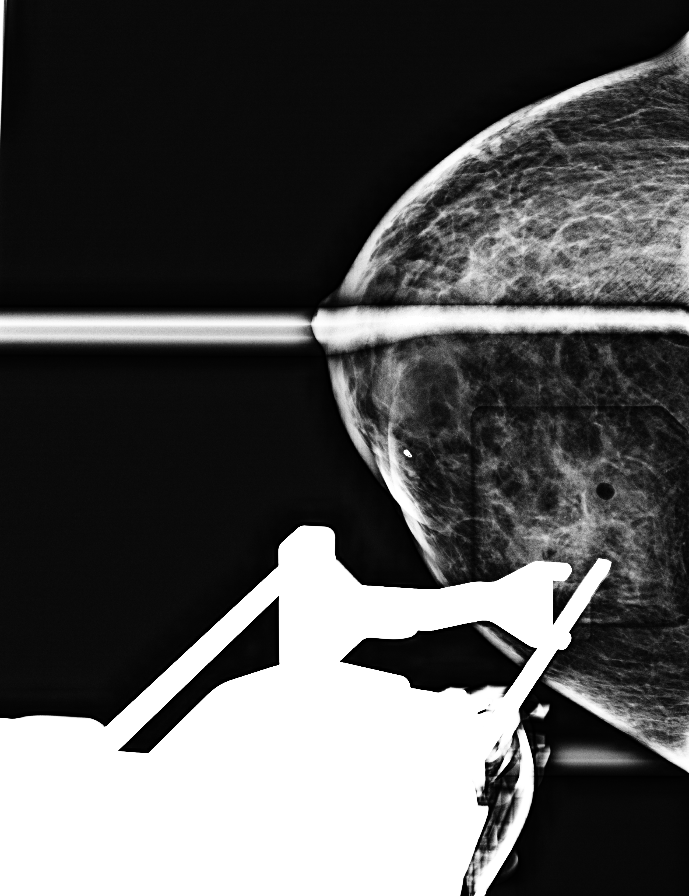

[L CC (2 of 3)]
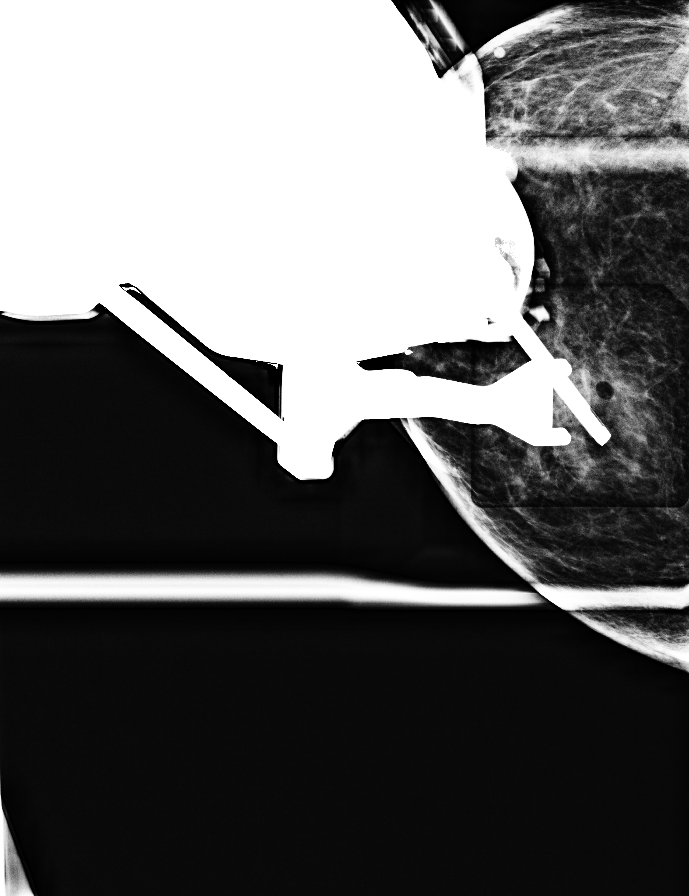

[L CC (3 of 3)]
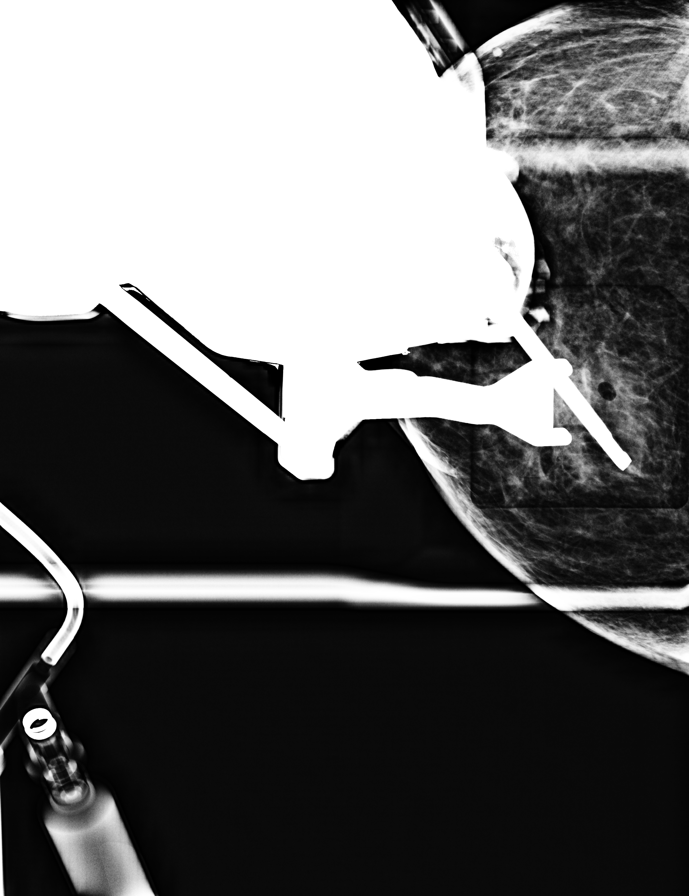

[8 of 15 positions shown; findings below may reference images not displayed]



Using sterile technique with chlorhexidine as skin antisepsis, 1%
lidocaine and 1% lidocaine local anesthetic as local anesthetic,
under stereotactic guidance, a 9 gauge Brevera vacuum assisted
device was used to perform core needle biopsy of the posterior
extent of the group of calcifications involving the upper outer
quadrant of the left breast using a superior approach. Specimen
radiograph was performed showing calcifications in at least 3 of the
core samples. Specimens with calcifications are identified for
pathology.

Lesion quadrant: Upper outer quadrant.

At the conclusion of the procedure, an X shaped tissue marker clip
was deployed into the biopsy cavity. Follow-up 2-view mammogram was
performed and dictated separately.
IMPRESSION: Stereotactic-guided biopsy of the posterior extent of a segmental
group of calcifications in the upper outer quadrant of the left
breast. No apparent complications. (Previous biopsy of the anterior
extent of the calcifications demonstrated intermediate grade DCIS).

## 2018-07-19 ENCOUNTER — Inpatient Hospital Stay: Payer: BLUE CROSS/BLUE SHIELD | Admitting: Hematology

## 2018-07-20 ENCOUNTER — Telehealth: Payer: Self-pay | Admitting: Hematology

## 2018-07-20 ENCOUNTER — Encounter: Payer: Self-pay | Admitting: *Deleted

## 2018-07-20 NOTE — Telephone Encounter (Signed)
Called pt re appt that was added per 8/8 sch msg - left vm for pt re appts.

## 2018-07-31 ENCOUNTER — Telehealth: Payer: Self-pay | Admitting: Nurse Practitioner

## 2018-07-31 NOTE — Telephone Encounter (Signed)
Patient left message 8/19 to cx 8/21 appointment. Per patient she will call back to reschedule when she is ready to come in.

## 2018-08-01 ENCOUNTER — Ambulatory Visit: Payer: BLUE CROSS/BLUE SHIELD | Admitting: Nurse Practitioner

## 2018-08-07 ENCOUNTER — Encounter: Payer: Self-pay | Admitting: Radiation Oncology

## 2018-08-07 ENCOUNTER — Telehealth: Payer: Self-pay | Admitting: Hematology

## 2018-08-07 NOTE — Telephone Encounter (Signed)
Tried calling patient, unable to [facility] due to mail box being full.  Letter/Calendar mailed to patient per 8/26 sch msg

## 2018-08-07 NOTE — Progress Notes (Signed)
Error, duplicate

## 2018-08-08 ENCOUNTER — Telehealth: Payer: Self-pay | Admitting: Radiation Oncology

## 2018-08-08 ENCOUNTER — Ambulatory Visit
Admission: RE | Admit: 2018-08-08 | Discharge: 2018-08-08 | Disposition: A | Discharge status: Home / Self Care | Payer: BLUE CROSS/BLUE SHIELD | Source: Ambulatory Visit | Attending: Radiation Oncology | Admitting: Radiation Oncology

## 2018-08-08 HISTORY — DX: Personal history of irradiation: Z92.3

## 2018-08-08 NOTE — Telephone Encounter (Signed)
Received voice message from upstairs operator that this patient want to cancel today's appointment with Dr. Isidore Moos 08/08/18 @ 3:50 she states she had called previous but apparently we did not get the message. She will call back to reschedule her follow up appointment.

## 2018-08-14 ENCOUNTER — Inpatient Hospital Stay: Payer: BLUE CROSS/BLUE SHIELD | Attending: Hematology | Admitting: Hematology

## 2018-08-23 ENCOUNTER — Telehealth: Payer: Self-pay | Admitting: Hematology

## 2018-08-23 NOTE — Telephone Encounter (Signed)
Patient called to reschedule  °

## 2018-08-28 NOTE — Progress Notes (Signed)
Deanna Roberts presents for follow up of radiation completed 07/04/18 to her left breast. She has an apt on 09/28/18 with Dr. Burr Medico. She will see survivorship on 11/23/18. She reports that her skin has healed well. There is mild hyperpigmentation present. She is using cocoa butter to her radiation site.   BP 123/77 (BP Location: Right Arm)   Pulse 77   Temp 98.5 F (36.9 C) (Oral)   Wt 139 lb 12.8 oz (63.4 kg)   BMI 21.90 kg/m    Wt Readings from Last 3 Encounters:  08/31/18 139 lb 12.8 oz (63.4 kg)  06/11/18 140 lb (63.5 kg)  04/06/18 137 lb 9.6 oz (62.4 kg)

## 2018-08-31 ENCOUNTER — Ambulatory Visit
Admission: RE | Admit: 2018-08-31 | Discharge: 2018-08-31 | Disposition: A | Discharge status: Home / Self Care | Payer: BLUE CROSS/BLUE SHIELD | Source: Ambulatory Visit | Attending: Radiation Oncology | Admitting: Radiation Oncology

## 2018-08-31 ENCOUNTER — Other Ambulatory Visit: Payer: Self-pay

## 2018-08-31 ENCOUNTER — Encounter: Payer: Self-pay | Admitting: Radiation Oncology

## 2018-08-31 VITALS — BP 123/77 | HR 77 | Temp 98.5°F | Wt 139.8 lb

## 2018-08-31 DIAGNOSIS — C50412 Malignant neoplasm of upper-outer quadrant of left female breast: Secondary | ICD-10-CM

## 2018-08-31 DIAGNOSIS — Z7982 Long term (current) use of aspirin: Secondary | ICD-10-CM | POA: Diagnosis not present

## 2018-08-31 DIAGNOSIS — Z79899 Other long term (current) drug therapy: Secondary | ICD-10-CM | POA: Insufficient documentation

## 2018-08-31 DIAGNOSIS — D0512 Intraductal carcinoma in situ of left breast: Secondary | ICD-10-CM | POA: Insufficient documentation

## 2018-08-31 DIAGNOSIS — Z170 Estrogen receptor positive status [ER+]: Secondary | ICD-10-CM

## 2018-08-31 NOTE — Progress Notes (Signed)
Radiation Oncology         (336) [phone] ________________________________  Name: Deanna Roberts MRN: [id number]  Date: 08/31/2018  DOB: 06-05-1966  Follow-Up Visit Note  Outpatient  CC: Glendon Axe, MD  Erroll Luna, MD  Diagnosis and Prior Radiotherapy:    ICD-10-CM   1. Ductal carcinoma in situ (DCIS) of left breast D05.12   2. Carcinoma of upper-outer quadrant of left breast in female, estrogen receptor positive ([place]) C50.412    Z17.0     Cancer Staging Ductal carcinoma in situ (DCIS) of left breast Staging form: Breast, AJCC 8th Edition - Clinical stage from 01/04/2018: Stage 0 (cTis (DCIS), cN0, cM0, ER: Positive, PR: Positive, HER2: Not assessed ) - Signed by Truitt Merle, MD on 01/26/2018 - Pathologic: Stage 0 (pTis (DCIS), pN0, cM0, ER+, PR+, HER2-) - Signed by Truitt Merle, MD on 07/19/2018  Radiation treatment dates:  05/30/18 - 07/04/18  Site/dose:    1. Breast, Left/ total of 40.05 Gy in 15 fractions of 2.67 Gy 2. Boost, Breast, Left/ total of 10 Gy in 5 fractions of 2 Gy  CHIEF COMPLAINT: Here for follow-up and surveillance of left breast cancer  Narrative:  The patient returns today for routine follow-up following completion of her radiation therapy on 07/04/2018. Pt has tried coco butter daily with mild relief of her skin following radiation treatments. She denies any other symptoms. She has a follow up appointment with Dr. Burr Medico on 09/28/2018.                          ALLERGIES:  has No Known Allergies.  Meds: Current Outpatient Medications  Medication Sig Dispense Refill  . aspirin 81 MG chewable tablet Chew 81 mg by mouth daily.    Marland Kitchen ezetimibe (ZETIA) 10 MG tablet   4  . hydrochlorothiazide (HYDRODIURIL) 25 MG tablet Take 25 mg by mouth daily.    Marland Kitchen HYDROcodone-acetaminophen (NORCO/VICODIN) 5-325 MG tablet Take 1 tablet by mouth every 6 (six) hours as needed for moderate pain. Prescribed by Dr. Clayborne Dana with [facility].    Marland Kitchen lisinopril  (PRINIVIL,ZESTRIL) 10 MG tablet Take 10 mg by mouth daily.    . metroNIDAZOLE (FLAGYL) 500 MG tablet Take 1 tablet (500 mg total) by mouth 2 (two) times daily. 14 tablet 0   No current facility-administered medications for this encounter.     Physical Findings: The patient is in no acute distress. Patient is alert and oriented.  weight is 139 lb 12.8 oz (63.4 kg). Her oral temperature is 98.5 F (36.9 C). Her blood pressure is 123/77 and her pulse is 77. .    Satisfactory skin healing in radiotherapy fields. Mild hyperpigmentation over left breast, overall skin has healed very well.      Lab Findings: Lab Results  Component Value Date   WBC 3.7 (L) 03/10/2009   HGB 11.1 (L) 03/10/2009   HCT 33.1 (L) 03/10/2009   MCV 90.1 03/10/2009   PLT 184.0 03/10/2009    Radiographic Findings: No results found.  Impression/Plan: Healing well from radiotherapy to the breast tissue.  Continue skin care with topical cocoa butter for at least 2 more months for further healing.  I encouraged her to continue with yearly mammography and followup with medical oncology. I will see her back on an as-needed basis. I have encouraged her to call if she has any issues or concerns in the future. I wished her the very best.   _____________________________________  Eppie Gibson, MD  This document serves as a record of services personally performed by Eppie Gibson, MD. It was created on her behalf by Steva Colder, a trained medical scribe. The creation of this record is based on the scribe's personal observations and the provider's statements to them. This document has been checked and approved by the attending provider.

## 2018-09-28 ENCOUNTER — Encounter: Payer: Self-pay | Admitting: Nurse Practitioner

## 2018-09-28 ENCOUNTER — Inpatient Hospital Stay: Payer: BLUE CROSS/BLUE SHIELD | Attending: Hematology | Admitting: Nurse Practitioner

## 2018-09-28 VITALS — BP 136/84 | HR 66 | Temp 99.2°F | Resp 20 | Ht 67.0 in | Wt 141.9 lb

## 2018-09-28 DIAGNOSIS — I2510 Atherosclerotic heart disease of native coronary artery without angina pectoris: Secondary | ICD-10-CM | POA: Diagnosis not present

## 2018-09-28 DIAGNOSIS — Z7982 Long term (current) use of aspirin: Secondary | ICD-10-CM | POA: Diagnosis not present

## 2018-09-28 DIAGNOSIS — Z79899 Other long term (current) drug therapy: Secondary | ICD-10-CM | POA: Diagnosis not present

## 2018-09-28 DIAGNOSIS — Z170 Estrogen receptor positive status [ER+]: Secondary | ICD-10-CM | POA: Insufficient documentation

## 2018-09-28 DIAGNOSIS — Z923 Personal history of irradiation: Secondary | ICD-10-CM | POA: Diagnosis not present

## 2018-09-28 DIAGNOSIS — I10 Essential (primary) hypertension: Secondary | ICD-10-CM | POA: Diagnosis not present

## 2018-09-28 DIAGNOSIS — I252 Old myocardial infarction: Secondary | ICD-10-CM | POA: Insufficient documentation

## 2018-09-28 DIAGNOSIS — Z90710 Acquired absence of both cervix and uterus: Secondary | ICD-10-CM | POA: Diagnosis not present

## 2018-09-28 DIAGNOSIS — E119 Type 2 diabetes mellitus without complications: Secondary | ICD-10-CM | POA: Insufficient documentation

## 2018-09-28 DIAGNOSIS — D0512 Intraductal carcinoma in situ of left breast: Secondary | ICD-10-CM | POA: Diagnosis present

## 2018-09-28 DIAGNOSIS — Z780 Asymptomatic menopausal state: Secondary | ICD-10-CM

## 2018-09-28 NOTE — Patient Instructions (Signed)
Letrozole tablets What is this medicine? LETROZOLE (LET roe zole) blocks the production of estrogen. It is used to treat breast cancer. This medicine may be used for other purposes; ask your health care provider or pharmacist if you have questions. COMMON BRAND NAME(S): Femara What should I tell my health care provider before I take this medicine? They need to know if you have any of these conditions: -high cholesterol -liver disease -osteoporosis (weak bones) -an unusual or allergic reaction to letrozole, other medicines, foods, dyes, or preservatives -pregnant or trying to get pregnant -breast-feeding How should I use this medicine? Take this medicine by mouth with a glass of water. You may take it with or without food. Follow the directions on the prescription label. Take your medicine at regular intervals. Do not take your medicine more often than directed. Do not stop taking except on your doctor's advice. Talk to your pediatrician regarding the use of this medicine in children. Special care may be needed. Overdosage: If you think you have taken too much of this medicine contact a poison control center or emergency room at once. NOTE: This medicine is only for you. Do not share this medicine with others. What if I miss a dose? If you miss a dose, take it as soon as you can. If it is almost time for your next dose, take only that dose. Do not take double or extra doses. What may interact with this medicine? Do not take this medicine with any of the following medications: -estrogens, like hormone replacement therapy or birth control pills This medicine may also interact with the following medications: -dietary supplements such as androstenedione or DHEA -prasterone -tamoxifen This list may not describe all possible interactions. Give your health care provider a list of all the medicines, herbs, non-prescription drugs, or dietary supplements you use. Also tell them if you smoke, drink  alcohol, or use illegal drugs. Some items may interact with your medicine. What should I watch for while using this medicine? Tell your doctor or healthcare professional if your symptoms do not start to get better or if they get worse. Do not become pregnant while taking this medicine or for 3 weeks after stopping it. Women should inform their doctor if they wish to become pregnant or think they might be pregnant. There is a potential for serious side effects to an unborn child. Talk to your health care professional or pharmacist for more information. Do not breast-feed while taking this medicine or for 3 weeks after stopping it. This medicine may interfere with the ability to have a child. Talk with your doctor or health care professional if you are concerned about your fertility. Using this medicine for a long time may increase your risk of low bone mass. Talk to your doctor about bone health. You may get drowsy or dizzy. Do not drive, use machinery, or do anything that needs mental alertness until you know how this medicine affects you. Do not stand or sit up quickly, especially if you are an older patient. This reduces the risk of dizzy or fainting spells. You may need blood work done while you are taking this medicine. What side effects may I notice from receiving this medicine? Side effects that you should report to your doctor or health care professional as soon as possible: -allergic reactions like skin rash, itching, or hives -bone fracture -chest pain -signs and symptoms of a blood clot such as breathing problems; changes in vision; chest pain; severe, sudden headache; pain, swelling,   warmth in the leg; trouble speaking; sudden numbness or weakness of the face, arm or leg -vaginal bleeding Side effects that usually do not require medical attention (report to your doctor or health care professional if they continue or are bothersome): -bone, back, joint, or muscle  pain -dizziness -fatigue -fluid retention -headache -hot flashes, night sweats -nausea -weight gain This list may not describe all possible side effects. Call your doctor for medical advice about side effects. You may report side effects to FDA at 1-800-FDA-1088. Where should I keep my medicine? Keep out of the reach of children. Store between 15 and 30 degrees C (59 and 86 degrees F). Throw away any unused medicine after the expiration date. NOTE: This sheet is a summary. It may not cover all possible information. If you have questions about this medicine, talk to your doctor, pharmacist, or health care provider.  2018 Elsevier/Gold Standard (2016-07-04 11:10:41) Anastrozole tablets What is this medicine? ANASTROZOLE (an AS troe zole) is used to treat breast cancer in women who have gone through menopause. Some types of breast cancer depend on estrogen to grow, and this medicine can stop tumor growth by blocking estrogen production. This medicine may be used for other purposes; ask your health care provider or pharmacist if you have questions. COMMON BRAND NAME(S): Arimidex What should I tell my health care provider before I take this medicine? They need to know if you have any of these conditions: -liver disease -an unusual or allergic reaction to anastrozole, other medicines, foods, dyes, or preservatives -pregnant or trying to get pregnant -breast-feeding How should I use this medicine? Take this medicine by mouth with a glass of water. Follow the directions on the prescription label. You can take this medicine with or without food. Take your doses at regular intervals. Do not take your medicine more often than directed. Do not stop taking except on the advice of your doctor or health care professional. Talk to your pediatrician regarding the use of this medicine in children. Special care may be needed. Overdosage: If you think you have taken too much of this medicine contact a  poison control center or emergency room at once. NOTE: This medicine is only for you. Do not share this medicine with others. What if I miss a dose? If you miss a dose, take it as soon as you can. If it is almost time for your next dose, take only that dose. Do not take double or extra doses. What may interact with this medicine? Do not take this medicine with any of the following medications: -female hormones, like estrogens or progestins and birth control pills This medicine may also interact with the following medications: -tamoxifen This list may not describe all possible interactions. Give your health care provider a list of all the medicines, herbs, non-prescription drugs, or dietary supplements you use. Also tell them if you smoke, drink alcohol, or use illegal drugs. Some items may interact with your medicine. What should I watch for while using this medicine? Visit your doctor or health care professional for regular checks on your progress. Let your doctor or health care professional know about any unusual vaginal bleeding. Do not treat yourself for diarrhea, nausea, vomiting or other side effects. Ask your doctor or health care professional for advice. What side effects may I notice from receiving this medicine? Side effects that you should report to your doctor or health care professional as soon as possible: -allergic reactions like skin rash, itching or hives, swelling of  the face, lips, or tongue -any new or unusual symptoms -breathing problems -chest pain -leg pain or swelling -vomiting Side effects that usually do not require medical attention (report to your doctor or health care professional if they continue or are bothersome): -back or bone pain -cough, or throat infection -diarrhea or constipation -dizziness -headache -hot flashes -loss of appetite -nausea -sweating -weakness and tiredness -weight gain This list may not describe all possible side effects. Call  your doctor for medical advice about side effects. You may report side effects to FDA at 1-800-FDA-1088. Where should I keep my medicine? Keep out of the reach of children. Store at room temperature between 20 and 25 degrees C (68 and 77 degrees F). Throw away any unused medicine after the expiration date. NOTE: This sheet is a summary. It may not cover all possible information. If you have questions about this medicine, talk to your doctor, pharmacist, or health care provider.  2018 Elsevier/Gold Standard (2008-02-08 16:31:52)

## 2018-09-28 NOTE — Progress Notes (Signed)
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    01/04/2018 Receptors her2    Results:Breast, left, needle core biopsy, upper outer quadrant at posterior depth Estrogen Receptor: 95%, POSITIVE, MODERATE STAINING INTENSITY Progesterone Receptor: 2%, POSITIVE, WEAK STAINING INTENSITY Results: Breast, left, needle core biopsy, UOQ Estrogen Receptor: 100%, POSITIVE, STRONG STAINING INTENSITY Progesterone Receptor: 30%, POSITIVE, MODERATE STAINING INTENSITY    01/04/2018 Initial Biopsy    Diagnosis 01/04/18 Breast, left, needle core biopsy, upper outer quadrant at posterior depth - DUCTAL CARCINOMA IN SITU WITH CALCIFICATIONS, SEE COMMENT. Microscopic Comment The carcinoma appears intermediate grade. Prognostic markers will be ordered. The case was called to [facility] on 01/05/2018. Diagnosis 01/04/18 Breast, left, needle core biopsy, UOQ - DUCTAL CARCINOMA IN SITU WITH CALCIFICATIONS. - SEE COMMENT. Microscopic Comment The carcinoma appears intermediate grade. Estrogen receptor and progesterone receptor studies will be performed and the results  reported separately. The results were called to the [facility] on 01/02/2018.       03/22/2018 Surgery    LEFT BREAST LUMPECTOMY WITH BRACKETED RADIOACTIVE SEED LOCALIZATION by Dr. Brantley Stage  03/22/18     03/22/2018 Pathology Results    Diagnosis 03/22/18 Breast, lumpectomy, Left w/seed x2 - MULTIFOCAL DUCTAL CARCINOMA IN SITU. - DCIS FOCALLY LESS THAN 0.1 CM FROM POSTERIOR MARGIN, 0.1 CM FROM LATERAL AND SUPERIOR MARGIN AND 0.3 CM FROM ANTERIOR MARGIN. - PREVIOUS BIOPSY SITES AND BIOPSY CLIPS. - FIBROCYSTIC CHANGES WITH CALCIFICATIONS. - NO INVASIVE CARCINOMA    05/04/2018 Mammogram    05/04/2018 Mammogram IMPRESSION: 1. Status post left lumpectomy with surgical absence of the previously biopsied malignant left breast calcifications. 2. Stable small number of scattered, tiny, benign calcifications elsewhere in the left breast. 3. No evidence of malignancy    05/30/2018 - 07/04/2018 Radiation Therapy    She completed adjuvant radiation with Dr. Isidore Roberts, 05/30/18-07/04/18.     07/19/2018 Cancer Staging    Staging form: Breast, AJCC 8th Edition - Pathologic: Stage 0 (pTis (DCIS), pN0, cM0, ER+, PR+, HER2-) - Signed by Deanna Merle, MD on 07/19/2018   PRIOR THERAPY s/p lumpectomy 03/22/2018 and adjuvant radiation per Dr. Isidore Roberts 05/30/18 - 07/04/18   INTERVAL HISTORY: Deanna Roberts returns for follow up. She was last seen 01/2018 in consult. She underwent lumpectomy and completed adjuvant radiation in the interim. She tolerated both well. She feels well. Has occasional left breast pain with heavy lifting or lying on her breast. She has chronic joint pain anywhere from "neck to feet," which can be moderate depending on activity. She is on hydrocodone. Denies new lump, nipple discharge, or inversion. She has good energy level and normal appetite. Denies n/v/c/d, fever, chills, cough, chest  pain, dyspnea, or leg edema.   MEDICAL HISTORY:  Past Medical History:  Diagnosis Date  . Cancer  ([place])   . Coronary artery disease   . Diabetes mellitus without complication ([place])   . History of radiation therapy 05/30/18- 07/04/18   Left Breast 40.05 Gy in 15 fractions, Left Breast boost 5 Gy in 5 fractions.   . Hypertension   . Myocardial infarction [facility]) 2013    SURGICAL HISTORY: Past Surgical History:  Procedure Laterality Date  . ABDOMINAL HYSTERECTOMY    . BREAST LUMPECTOMY WITH RADIOACTIVE SEED LOCALIZATION Left 03/22/2018   Procedure: LEFT BREAST LUMPECTOMY WITH BRACKETED RADIOACTIVE SEED LOCALIZATION;  Surgeon: Deanna Luna, MD;  Location: [facility];  Service: General;  Laterality: Left;    I have reviewed the social history and family history with the patient and they are unchanged from previous note.  ALLERGIES:  has No Known Allergies.  MEDICATIONS:  Current Outpatient Medications  Medication Sig Dispense Refill  . aspirin 81 MG chewable tablet Chew 81 mg by mouth daily.    Marland Kitchen ezetimibe (ZETIA) 10 MG tablet   4  . hydrochlorothiazide (HYDRODIURIL) 25 MG tablet Take 25 mg by mouth daily.    Marland Kitchen HYDROcodone-acetaminophen (NORCO/VICODIN) 5-325 MG tablet Take 1 tablet by mouth every 6 (six) hours as needed for moderate pain. Prescribed by Deanna Roberts with [facility] medical center.    Marland Kitchen lisinopril (PRINIVIL,ZESTRIL) 10 MG tablet Take 10 mg by mouth daily.    . metroNIDAZOLE (FLAGYL) 500 MG tablet Take 1 tablet (500 mg total) by mouth 2 (two) times daily. 14 tablet 0   No current facility-administered medications for this visit.     PHYSICAL EXAMINATION: ECOG PERFORMANCE STATUS: 1 - Symptomatic but completely ambulatory  Vitals:   09/28/18 1621  BP: 136/84  Pulse: 66  Resp: 20  Temp: 99.2 F (37.3 C)  SpO2: 100%   Filed Weights   09/28/18 1621  Weight: 141 lb 14.4 oz (64.4 kg)    GENERAL:alert, no distress and comfortable SKIN: no rashes or significant lesions EYES: sclera clear OROPHARYNX:no thrush or ulcers  LYMPH:  no palpable  cervical, supraclavicular, or axilalry lymphadenopathy LUNGS: clear to auscultation with normal breathing effort HEART: regular rate & rhythm, no lower extremity edema ABDOMEN:abdomen soft, non-tender and normal bowel sounds Musculoskeletal:no cyanosis of digits and no clubbing  NEURO: alert & oriented x 3 with fluent speech, no focal motor/sensory deficits Breast exam: inspection shows them to be symmetrical without nipple discharge or inversion. S/p left lumpectomy, incision well healed. Mild diffuse hyperpigmentation secondary to radiation. No palpable mass in either breast or axilla that I could appreciate   LABORATORY DATA:  I have reviewed the data as listed CBC Latest Ref Rng & Units 03/10/2009 11/10/2008 01/03/2008  WBC 4.5 - 10.5 10*3/microliter 3.7(L) 3.5(L) 3.9  Hemoglobin 12.0 - 15.0 g/dL 11.1(L) 10.9(L) 11.4(L)  Hematocrit 36.0 - 46.0 % 33.1(L) 32.7(L) 33.5(L)  Platelets 150.0 - 400.0 K/uL 184.0 180 193     CMP Latest Ref Rng & Units 03/16/2018 11/10/2008 10/02/2007  Glucose 65 - 99 mg/dL 78 82 83  BUN 6 - 20 mg/dL <5(L) 12 7  Creatinine 0.44 - 1.00 mg/dL 0.66 0.6 0.7  Sodium 135 - 145 mmol/L 140 144 141  Potassium 3.5 - 5.1 mmol/L 3.9 4.0 3.9  Chloride 101 - 111 mmol/L 106 111 108  CO2 22 - 32 mmol/L 24 29 28   Calcium 8.9 - 10.3 mg/dL 8.7(L) 8.7 8.5  Total Protein  6.0 - 8.3 g/dL - 6.5 6.2  Total Bilirubin 0.3 - 1.2 mg/dL - 0.7 0.4  Alkaline Phos 39 - 117 units/L - 34(L) 31(L)  AST 0 - 37 units/L - 16 18  ALT 0 - 35 units/L - 11 21     SURGICAL PATH:   Diagnosis 03/22/18  Breast, lumpectomy, Left w/seed x2 - MULTIFOCAL DUCTAL CARCINOMA IN SITU. - DCIS FOCALLY LESS THAN 0.1 CM FROM POSTERIOR MARGIN, 0.1 CM FROM LATERAL AND SUPERIOR MARGIN AND 0.3 CM FROM ANTERIOR MARGIN. - PREVIOUS BIOPSY SITES AND BIOPSY CLIPS. - FIBROCYSTIC CHANGES WITH CALCIFICATIONS. - NO INVASIVE CARCINOMA. Microscopic Comment BREAST, IN SITU CARCINOMA Specimen, including laterality: Left  breast. Procedure (include lymph node sampling sentinel-non-sentinel: Localization lumpectomy without lymph nodes. Grade of carcinoma: Intermediate. Necrosis: No. Estimated tumor size: (gross measurement or glass slide measurement): 1.5, 1.0 and 0.3 cm. Distance to closest margin: Less than 0.1 cm from the posterior margin, 0.1 cm from the lateral and superior margins and 0.3 cm from anterior margin. If margin positive, focally or broadly: N/A. Breast prognostic profile: Case [id number] Estrogen receptor: 100% and 95%. Progesterone receptor: 30% and 2%. Lymph nodes: Examined: 0 Sentinel 0 Non-sentinel 0 Total Lymph nodes with metastasis: N/A. Isolated tumor cells (< 0.2 mm): N/A. Micrometastasis ( > 0.2 mm and < 2.0 mm): N/A. Macrometastasis (> 2.0 mm): N/A. Extranodal extension: N/A. TNM: pTis, pNX. 1 of 2 FINAL for Deanna Roberts, Deanna Roberts [phone]) Microscopic Comment(continued) Comments: In the area of the previous biopsy clips there are foci of residual ductal carcinoma in situ and there is ductal carcinoma in situ with calcifications in the central portion of the specimen. Ductal carcinoma in situ is focally less than 0.1 cm from the posterior margin, and is 0.1 cm from the lateral and superior margins, and 0.3 cm from the anterior margin. (JDP:ah 03/23/18)   RADIOGRAPHIC STUDIES: I have personally reviewed the radiological images as listed and agreed with the findings in the report. No results found.   ASSESSMENT & PLAN: Giorgia Wahler is 52 year-old post-menopausal woman, presented with screening discovered to DCIS.  1. Left breast DCIS, grade 2, ER+ /PR + -Ms. Siebenaler is clinically doing well. She underwent left lumpectomy in 03/2018 and completed adjuvant radiation in 06/2018. She is healing well. Breast exam is normal, no clinical concern for recurrence. She will be due mammogram in 12/2018, I ordered today.  -Given ER/PR positive disease, I do recommend adjuvant aromatase  inhibitor to reduce her risk of cancer recurrence, she is menopausal s/p hysterectomy.  - The potential benefit and side effects, which include but not limited to hot flash, skin and vaginal dryness, metabolic changes ( increased blood glucose, cholesterol, weight, etc.), slightly in increased risk of cardiovascular disease, cataracts, muscular and joint discomfort, osteopenia and osteoporosis, etc were discussed with her in great details. She is not interested based on the side effect profile. She does agree to annual mammogram and surveillance f/u with [place].  -I gave her printed material on AI today -I encouraged her to f/u with PCP for routine health maintenance, exercise often, and eat well -she declined flu vaccine today  -she will see survivorship NP in December 2019 then f/u with Dr. Burr Medico in 1 year.  -She will call if she decides to pursue adjuvant AI.    PLAN -Survivorship NP in 11/2018 -Mammogram in 12/2018, ordered today -Return in 1 year for f/u with Dr. Burr Medico -Patient will call if she decides to pursue [place]  This Encounter  Procedures  . MM DIAG BREAST TOMO BILATERAL    Standing Status:   Future    Standing Expiration Date:   09/29/2019    Order Specific Question:   Reason for Exam (SYMPTOM  OR DIAGNOSIS REQUIRED)    Answer:   h/o left breast DCIS s/p lumpectomy and adjuvant RT    Order Specific Question:   Is the patient pregnant?    Answer:   No    Order Specific Question:   Preferred imaging location?    Answer:   [facility]   All questions were answered. The patient knows to call the clinic with any problems, questions or concerns. No barriers to learning was detected. I spent 20 minutes counseling the patient face to face. The total time spent in the appointment was 25 minutes and more than 50% was on counseling and review of test results     Alla Feeling, NP 09/28/18

## 2018-10-01 ENCOUNTER — Telehealth: Payer: Self-pay | Admitting: Hematology

## 2018-10-01 NOTE — Telephone Encounter (Signed)
Appts scheduled letter/calendar mailed per 10/18 los

## 2018-11-22 ENCOUNTER — Telehealth: Payer: Self-pay | Admitting: Adult Health

## 2018-11-22 NOTE — Telephone Encounter (Signed)
Called patient per 12/12 sch message - unable to reach patient - left message for patient to call back to r/s

## 2018-11-23 ENCOUNTER — Inpatient Hospital Stay: Payer: BLUE CROSS/BLUE SHIELD | Attending: Hematology | Admitting: Adult Health

## 2018-12-21 ENCOUNTER — Ambulatory Visit
Admission: RE | Admit: 2018-12-21 | Discharge: 2018-12-21 | Disposition: A | Discharge status: Home / Self Care | Payer: BLUE CROSS/BLUE SHIELD | Source: Ambulatory Visit | Attending: Nurse Practitioner | Admitting: Nurse Practitioner

## 2018-12-21 DIAGNOSIS — D0512 Intraductal carcinoma in situ of left breast: Secondary | ICD-10-CM

## 2019-10-01 ENCOUNTER — Telehealth: Payer: Self-pay | Admitting: Hematology

## 2019-10-01 NOTE — Telephone Encounter (Signed)
YF PAL 10/23 moved appointment to 11/4. Left message.

## 2019-10-03 ENCOUNTER — Telehealth: Payer: Self-pay | Admitting: Hematology

## 2019-10-03 NOTE — Telephone Encounter (Signed)
Left reminder message re 10/23 appointment being moved to 11/4. Schedule mailed.

## 2019-10-04 ENCOUNTER — Other Ambulatory Visit: Payer: BLUE CROSS/BLUE SHIELD

## 2019-10-04 ENCOUNTER — Ambulatory Visit: Payer: BLUE CROSS/BLUE SHIELD | Admitting: Hematology

## 2019-10-15 ENCOUNTER — Telehealth: Payer: Self-pay | Admitting: Hematology

## 2019-10-15 NOTE — Telephone Encounter (Signed)
Returned patient's phone call regarding cancelling an appointment, per patient's request appointment cancelled. Left a voicemail to reschedule.

## 2019-10-16 ENCOUNTER — Other Ambulatory Visit: Payer: Self-pay | Admitting: Hematology

## 2019-10-16 ENCOUNTER — Inpatient Hospital Stay: Payer: BLUE CROSS/BLUE SHIELD | Admitting: Hematology

## 2019-10-16 ENCOUNTER — Inpatient Hospital Stay: Payer: BLUE CROSS/BLUE SHIELD

## 2019-12-09 ENCOUNTER — Telehealth: Payer: Self-pay | Admitting: Hematology

## 2019-12-09 ENCOUNTER — Other Ambulatory Visit: Payer: Self-pay | Admitting: Nurse Practitioner

## 2019-12-09 ENCOUNTER — Other Ambulatory Visit: Payer: Self-pay | Admitting: Family Medicine

## 2019-12-09 DIAGNOSIS — Z9889 Other specified postprocedural states: Secondary | ICD-10-CM

## 2019-12-09 NOTE — Telephone Encounter (Signed)
Called pt per 12/28 sch message - per pt she does not want to schedule anything at the moment. Says she has too many other appts and will give [place] a call back to schedule an appt.

## 2019-12-27 ENCOUNTER — Ambulatory Visit
Admission: RE | Admit: 2019-12-27 | Discharge: 2019-12-27 | Disposition: A | Discharge status: Home / Self Care | Payer: Managed Care, Other (non HMO) | Source: Ambulatory Visit | Attending: Nurse Practitioner | Admitting: Nurse Practitioner

## 2019-12-27 ENCOUNTER — Other Ambulatory Visit: Payer: Self-pay

## 2019-12-27 ENCOUNTER — Other Ambulatory Visit: Payer: Self-pay | Admitting: Nurse Practitioner

## 2019-12-27 DIAGNOSIS — Z9889 Other specified postprocedural states: Secondary | ICD-10-CM

## 2020-01-01 ENCOUNTER — Telehealth: Payer: Self-pay | Admitting: Nurse Practitioner

## 2020-01-01 NOTE — Telephone Encounter (Signed)
Attempted to reach patient to review recent mammogram, recommendations, and f/u. No answer. Left generic VM to call me back. I will ask my scheduler to make an appt.

## 2020-01-02 ENCOUNTER — Telehealth: Payer: Self-pay | Admitting: Hematology

## 2020-01-02 NOTE — Telephone Encounter (Signed)
Called pt per 1/20 sch message to set up f/u appt - unable to reach pt left message for patient to call back to set up f/u

## 2020-01-15 ENCOUNTER — Telehealth: Payer: Self-pay | Admitting: Hematology

## 2020-01-15 NOTE — Telephone Encounter (Signed)
Patient returned phone call regarding rescheduling 11/11 cancelled appointment, per patient's request appointment has moved to 02/19.

## 2020-01-15 NOTE — Telephone Encounter (Signed)
Returned patient's phone call regarding scheduling an appointment, left a voicemail. 

## 2020-01-31 ENCOUNTER — Inpatient Hospital Stay: Payer: Managed Care, Other (non HMO) | Attending: Hematology

## 2020-01-31 ENCOUNTER — Other Ambulatory Visit: Payer: Self-pay | Admitting: *Deleted

## 2020-01-31 ENCOUNTER — Inpatient Hospital Stay (HOSPITAL_BASED_OUTPATIENT_CLINIC_OR_DEPARTMENT_OTHER): Payer: Managed Care, Other (non HMO) | Admitting: Physician Assistant

## 2020-01-31 ENCOUNTER — Other Ambulatory Visit: Payer: Self-pay | Admitting: Physician Assistant

## 2020-01-31 ENCOUNTER — Other Ambulatory Visit: Payer: Self-pay

## 2020-01-31 VITALS — BP 140/76 | HR 71 | Temp 97.8°F | Resp 17 | Ht 67.0 in | Wt 141.8 lb

## 2020-01-31 DIAGNOSIS — I10 Essential (primary) hypertension: Secondary | ICD-10-CM | POA: Diagnosis not present

## 2020-01-31 DIAGNOSIS — Z923 Personal history of irradiation: Secondary | ICD-10-CM | POA: Insufficient documentation

## 2020-01-31 DIAGNOSIS — I2510 Atherosclerotic heart disease of native coronary artery without angina pectoris: Secondary | ICD-10-CM | POA: Diagnosis not present

## 2020-01-31 DIAGNOSIS — D0512 Intraductal carcinoma in situ of left breast: Secondary | ICD-10-CM | POA: Insufficient documentation

## 2020-01-31 DIAGNOSIS — Z170 Estrogen receptor positive status [ER+]: Secondary | ICD-10-CM | POA: Insufficient documentation

## 2020-01-31 DIAGNOSIS — D509 Iron deficiency anemia, unspecified: Secondary | ICD-10-CM

## 2020-01-31 DIAGNOSIS — D649 Anemia, unspecified: Secondary | ICD-10-CM | POA: Diagnosis not present

## 2020-01-31 DIAGNOSIS — Z7982 Long term (current) use of aspirin: Secondary | ICD-10-CM | POA: Diagnosis not present

## 2020-01-31 DIAGNOSIS — E119 Type 2 diabetes mellitus without complications: Secondary | ICD-10-CM | POA: Insufficient documentation

## 2020-01-31 DIAGNOSIS — I252 Old myocardial infarction: Secondary | ICD-10-CM | POA: Diagnosis not present

## 2020-01-31 DIAGNOSIS — Z79899 Other long term (current) drug therapy: Secondary | ICD-10-CM | POA: Diagnosis not present

## 2020-01-31 DIAGNOSIS — D72819 Decreased white blood cell count, unspecified: Secondary | ICD-10-CM | POA: Insufficient documentation

## 2020-01-31 LAB — CMP (CANCER CENTER ONLY)
ALT: 8 U/L (ref 0–44)
AST: 16 U/L (ref 15–41)
Albumin: 3.4 g/dL — ABNORMAL LOW (ref 3.5–5.0)
Alkaline Phosphatase: 67 U/L (ref 38–126)
Anion gap: 8 (ref 5–15)
BUN: 8 mg/dL (ref 6–20)
CO2: 29 mmol/L (ref 22–32)
Calcium: 8.5 mg/dL — ABNORMAL LOW (ref 8.9–10.3)
Chloride: 107 mmol/L (ref 98–111)
Creatinine: 0.73 mg/dL (ref 0.44–1.00)
GFR, Est AFR Am: >60 mL/min (ref >60)
GFR, Estimated: >60 mL/min (ref >60)
Glucose, Bld: 82 mg/dL (ref 70–99)
Potassium: 4.2 mmol/L (ref 3.5–5.1)
Sodium: 144 mmol/L (ref 135–145)
Total Bilirubin: 0.3 mg/dL (ref 0.3–1.2)
Total Protein: 6.1 g/dL — ABNORMAL LOW (ref 6.5–8.1)

## 2020-01-31 LAB — CBC WITH DIFFERENTIAL (CANCER CENTER ONLY)
Abs Immature Granulocytes: 0.01 10*3/uL (ref 0.00–0.07)
Basophils Absolute: 0 10*3/uL (ref 0.0–0.1)
Basophils Relative: 0 %
Eosinophils Absolute: 0 10*3/uL (ref 0.0–0.5)
Eosinophils Relative: 0 %
HCT: 33.8 % — ABNORMAL LOW (ref 36.0–46.0)
Hemoglobin: 11.4 g/dL — ABNORMAL LOW (ref 12.0–15.0)
Immature Granulocytes: 0 %
Lymphocytes Relative: 33 %
Lymphs Abs: 1 10*3/uL (ref 0.7–4.0)
MCH: 30.4 pg (ref 26.0–34.0)
MCHC: 33.7 g/dL (ref 30.0–36.0)
MCV: 90.1 fL (ref 80.0–100.0)
Monocytes Absolute: 0.3 10*3/uL (ref 0.1–1.0)
Monocytes Relative: 9 %
Neutro Abs: 1.7 10*3/uL (ref 1.7–7.7)
Neutrophils Relative %: 58 %
Platelet Count: 169 10*3/uL (ref 150–400)
RBC: 3.75 MIL/uL — ABNORMAL LOW (ref 3.87–5.11)
RDW: 13.2 % (ref 11.5–15.5)
WBC Count: 2.9 10*3/uL — ABNORMAL LOW (ref 4.0–10.5)
nRBC: 0 % (ref 0.0–0.2)

## 2020-01-31 NOTE — Progress Notes (Signed)
[name]  [phone] [phone]  Clinic Follow up Note   Patient Care Team: Glendon Axe, MD as PCP - General (Family Medicine) Erroll Luna, MD as Consulting Physician (General Surgery) Truitt Merle, MD as Consulting Physician (Hematology)    SUMMARY OF ONCOLOGIC HISTORY:     Oncology History   Cancer Staging Ductal carcinoma in situ (DCIS) of left breast Staging form: Breast, AJCC 8th Edition - Clinical stage from 01/04/2018: Stage 0 (cTis (DCIS), cN0, cM0, ER: Positive, PR: Positive, HER2: Not assessed ) - Signed by Truitt Merle, MD on 01/26/2018 - Pathologic: Stage 0 (pTis (DCIS), pN0, cM0, ER+, PR+, HER2-) - Signed by Truitt Merle, MD on 07/19/2018       Ductal carcinoma in situ (DCIS) of left breast   12/27/2017 Mammogram    IMPRESSION: Indeterminate calcifications in the upper-outer quadrant of the left breast.    01/04/2018 Receptors her2    Results:Breast, left, needle core biopsy, upper outer quadrant at posterior depth Estrogen Receptor: 95%, POSITIVE, MODERATE STAINING INTENSITY Progesterone Receptor: 2%, POSITIVE, WEAK STAINING INTENSITY Results: Breast, left, needle core biopsy, UOQ Estrogen Receptor: 100%, POSITIVE, STRONG STAINING INTENSITY Progesterone Receptor: 30%, POSITIVE, MODERATE STAINING INTENSITY    01/04/2018 Initial Biopsy    Diagnosis 01/04/18 Breast, left, needle core biopsy, upper outer quadrant at posterior depth - DUCTAL CARCINOMA IN SITU WITH CALCIFICATIONS, SEE COMMENT. Microscopic Comment The carcinoma appears intermediate grade. Prognostic markers will be ordered. The case was called to [facility] on 01/05/2018. Diagnosis 01/04/18 Breast, left, needle core biopsy, UOQ - DUCTAL CARCINOMA IN SITU WITH CALCIFICATIONS. - SEE COMMENT. Microscopic Comment The carcinoma appears intermediate grade. Estrogen receptor and progesterone receptor studies will be performed and the  results reported separately. The results were called to [facility] on 01/02/2018.       03/22/2018 Surgery    LEFT BREAST LUMPECTOMY WITH BRACKETED RADIOACTIVE SEED LOCALIZATION by Dr. Brantley Stage  03/22/18     03/22/2018 Pathology Results    Diagnosis 03/22/18 Breast, lumpectomy, Left w/seed x2 - MULTIFOCAL DUCTAL CARCINOMA IN SITU. - DCIS FOCALLY LESS THAN 0.1 CM FROM POSTERIOR MARGIN, 0.1 CM FROM LATERAL AND SUPERIOR MARGIN AND 0.3 CM FROM ANTERIOR MARGIN. - PREVIOUS BIOPSY SITES AND BIOPSY CLIPS. - FIBROCYSTIC CHANGES WITH CALCIFICATIONS. - NO INVASIVE CARCINOMA    05/04/2018 Mammogram    05/04/2018 Mammogram IMPRESSION: 1. Status post left lumpectomy with surgical absence of the previously biopsied malignant left breast calcifications. 2. Stable small number of scattered, tiny, benign calcifications elsewhere in the left breast. 3. No evidence of malignancy    05/30/2018 - 07/04/2018 Radiation Therapy    She completed adjuvant radiation with Dr. Isidore Moos, 05/30/18-07/04/18.     07/19/2018 Cancer Staging    Staging form: Breast, AJCC 8th Edition - Pathologic: Stage 0 (pTis (DCIS), pN0, cM0, ER+, PR+, HER2-) - Signed by Truitt Merle, MD on 07/19/2018     12/27/2019 Mammogram    IMPRESSION: There is a single calcification in the left lumpectomy site which is stable. This may represent early developing fat necrosis. However, since the patient had DCIS which presented with round calcifications, a six-month follow-up is recommended. No evidence of malignancy on the right.  RECOMMENDATION: Six-month follow-up mammography of the left breast lumpectomy site.    PRIOR THERAPY s/p lumpectomy 03/22/2018 and adjuvant radiation per Dr. Isidore Moos 05/30/18 - 07/04/18   INTERVAL HISTORY:  [name] returns to the clinic for a follow up visit. The patient was  last seen in October 2019. She had a mammogram performed recently. Since having her mammogram  performed last month, she states that she has noticed a new breast lump near the site of her lumpectomy/adjuvant radiation treatment. She states tha there is associated pain in this region and characterizes her pain as a sharp pain.  She denies any new adenopathy,  nipple discharge, or inversion. She denies any fatigue, night sweats, or unexplained weight loss.  She denies any new bone pain except for her baseline knee pain for which she is prescribed hydrocodone by her PCP.  She denies any nausea, vomiting, diarrhea, or constipation. She denies any fever, chills, cough, chest pain, dyspnea, or leg edema. She recently had a repeat mammogram. She is here for a routine follow up visit and to review the results of her mammogram.   MEDICAL HISTORY: Past Medical History:  Diagnosis Date  . Cancer ([name])   . Coronary artery disease   . Diabetes mellitus without complication ([name])   . History of radiation therapy 05/30/18- 07/04/18   Left Breast 40.05 Gy in 15 fractions, Left Breast boost 5 Gy in 5 fractions.   . Hypertension   . Myocardial infarction [facility]) 2013    ALLERGIES:  has No Known Allergies.  MEDICATIONS:  Current Outpatient Medications  Medication Sig Dispense Refill  . aspirin 81 MG chewable tablet Chew 81 mg by mouth daily.    Marland Kitchen ezetimibe (ZETIA) 10 MG tablet   4  . hydrochlorothiazide (HYDRODIURIL) 25 MG tablet Take 25 mg by mouth daily.    Marland Kitchen HYDROcodone-acetaminophen (NORCO/VICODIN) 5-325 MG tablet Take 1 tablet by mouth every 6 (six) hours as needed for moderate pain. Prescribed by Dr. Clayborne Dana with [facility].    Marland Kitchen lisinopril (PRINIVIL,ZESTRIL) 10 MG tablet Take 10 mg by mouth daily.    . metroNIDAZOLE (FLAGYL) 500 MG tablet Take 1 tablet (500 mg total) by mouth 2 (two) times daily. 14 tablet 0   No current facility-administered medications for this visit.    SURGICAL HISTORY:  Past Surgical History:  Procedure Laterality Date  . ABDOMINAL HYSTERECTOMY    .  BREAST LUMPECTOMY Left 2019  . BREAST LUMPECTOMY WITH RADIOACTIVE SEED LOCALIZATION Left 03/22/2018   Procedure: LEFT BREAST LUMPECTOMY WITH BRACKETED RADIOACTIVE SEED LOCALIZATION;  Surgeon: Erroll Luna, MD;  Location: [place];  Service: General;  Laterality: Left;    REVIEW OF SYSTEMS:   Review of Systems  Constitutional: Negative for appetite change, chills, fatigue, fever and unexpected weight change.  HENT:   Negative for mouth sores, nosebleeds, sore throat and trouble swallowing.  Eyes: Negative for eye problems and icterus.  Respiratory: Negative for cough, hemoptysis, shortness of breath and wheezing.   Cardiovascular: Negative for chest pain and leg swelling.  Breast: Positive for breast nodule in left breast with associated tenderness. Gastrointestinal: Negative for abdominal pain, constipation, diarrhea, nausea and vomiting.  Genitourinary: Negative for bladder incontinence, difficulty urinating, dysuria, frequency and hematuria.   Musculoskeletal: Negative for back pain, gait problem, neck pain and neck stiffness.  Skin: Negative for itching and rash.  Neurological: Negative for dizziness, extremity weakness, gait problem, headaches, light-headedness and seizures.  Hematological: Negative for adenopathy. Does not bruise/bleed easily.  Psychiatric/Behavioral: Negative for confusion, depression and sleep disturbance. The patient is not nervous/anxious.     PHYSICAL EXAMINATION:  Blood pressure 140/76, pulse 71, temperature 97.8 F (36.6 C), temperature source Temporal, resp. rate 17, height 5' 7"  (1.702 m), weight 141 lb 12.8 oz (  64.3 kg), SpO2 99 %.  ECOG PERFORMANCE STATUS: 0 - Asymptomatic  Physical Exam  GENERAL:alert, no distress and comfortable SKIN: no rashes or significant lesions EYES: sclera clear OROPHARYNX:no thrush or ulcers  LYMPH:  no palpable cervical, supraclavicular, or axilalry lymphadenopathy LUNGS: clear to auscultation with  normal breathing effort HEART: regular rate & rhythm, no lower extremity edema ABDOMEN:abdomen soft, non-tender and normal bowel sounds Musculoskeletal:no cyanosis of digits and no clubbing  NEURO: alert & oriented x 3 with fluent speech, no focal motor/sensory deficits Breast exam: inspection shows them to be symmetrical without nipple discharge or inversion. S/p left lumpectomy, incision well healed. Small 3 mm nodule in the left breast in the 12-1 o'clock position. Chaperone present during breast exam: Kunee PA-SII  LABORATORY DATA: Lab Results  Component Value Date   WBC 2.9 (L) 01/31/2020   HGB 11.4 (L) 01/31/2020   HCT 33.8 (L) 01/31/2020   MCV 90.1 01/31/2020   PLT 169 01/31/2020      Chemistry      Component Value Date/Time   NA 144 01/31/2020 1308   K 4.2 01/31/2020 1308   CL 107 01/31/2020 1308   CO2 29 01/31/2020 1308   BUN 8 01/31/2020 1308   CREATININE 0.73 01/31/2020 1308      Component Value Date/Time   CALCIUM 8.5 (L) 01/31/2020 1308   ALKPHOS 67 01/31/2020 1308   AST 16 01/31/2020 1308   ALT 8 01/31/2020 1308   BILITOT 0.3 01/31/2020 1308     SURGICAL PATH:   Diagnosis 03/22/18  Breast, lumpectomy, Left w/seed x2 - MULTIFOCAL DUCTAL CARCINOMA IN SITU. - DCIS FOCALLY LESS THAN 0.1 CM FROM POSTERIOR MARGIN, 0.1 CM FROM LATERAL AND SUPERIOR MARGIN AND 0.3 CM FROM ANTERIOR MARGIN. - PREVIOUS BIOPSY SITES AND BIOPSY CLIPS. - FIBROCYSTIC CHANGES WITH CALCIFICATIONS. - NO INVASIVE CARCINOMA. Microscopic Comment BREAST, IN SITU CARCINOMA Specimen, including laterality: Left breast. Procedure (include lymph node sampling sentinel-non-sentinel: Localization lumpectomy without lymph nodes. Grade of carcinoma: Intermediate. Necrosis: No. Estimated tumor size: (gross measurement or glass slide measurement): 1.5, 1.0 and 0.3 cm. Distance to closest margin: Less than 0.1 cm from the posterior margin, 0.1 cm from the lateral and superior margins and 0.3 cm from  anterior margin. If margin positive, focally or broadly: N/A. Breast prognostic profile: Case [id number] Estrogen receptor: 100% and 95%. Progesterone receptor: 30% and 2%. Lymph nodes: Examined: 0 Sentinel 0 Non-sentinel 0 Total Lymph nodes with metastasis: N/A. Isolated tumor cells (< 0.2 mm): N/A. Micrometastasis ( > 0.2 mm and < 2.0 mm): N/A. Macrometastasis (> 2.0 mm): N/A. Extranodal extension: N/A. TNM: pTis, pNX. 1 of 2 FINAL for [place], [place] [phone]) Microscopic Comment(continued) Comments: In the area of the previous biopsy clips there are foci of residual ductal carcinoma in situ and there is ductal carcinoma in situ with calcifications in the central portion of the specimen. Ductal carcinoma in situ is focally less than 0.1 cm from the posterior margin, and is 0.1 cm from the lateral and superior margins, and 0.3 cm from the anterior margin. (JDP:ah 03/23/18)  RADIOGRAPHIC STUDIES:  No results found.  ASSESSMENT & PLAN: Moet Mikulski 26year-oldpost-menopausal woman, presented with screening discovered to DCIS. The patient was seen with Dr. Burr Medico today.   1.Leftbreast DCIS, grade2, ER+/PR + -Ms. Rylander is clinically doing well. She underwent left lumpectomy in 03/2018 and completed adjuvant radiation in 06/2018. She is healing well.  -She has a 3 mm tender nodule in the left breast that likely corresponds with  the area of fat necrosis seen on recent mammogram. No other concerning findings seen on mammogram. Per radiology, it was recommended that she have a 6 month follow up mammogram, which is already scheduled for 07/02/2020.  -Given ER/PR positive disease, at her last appointment in 09/2018, Dr. Burr Medico recommended adjuvant aromatase inhibitor to reduce her risk of cancer recurrence, she is menopausal s/p hysterectomy.  - The potential benefit and side effects, which include but not limited to hot flash, skin and vaginal dryness, metabolic changes ( increased  blood glucose, cholesterol, weight, etc.), slightly in increased risk of cardiovascular disease, cataracts, muscular and joint discomfort, osteopenia and osteoporosis, etc were discussed with her in great details. She is not interested based on the side effect profile. She does agree to routine mammogram and surveillance f/u with [place].  -I encouraged her to f/u with PCP for routine health maintenance, exercise often, and eat well -She will call if she decides to pursue adjuvant AI.  -She will obtain her mammogram in 6 months in 06/2020 and a follow up with [place] in 1 year or earlier depending on if there are any concerning findings seen on her next mammogram.  Anemia -The patient's repeat lab work demonstrated persistent and stable anemia likely related to her known history of IDA and sickle cell trait. She is asymptomatic. Will continue on observation  Leukocytopenia -She continues to have persistent and stable leukocytopenia that, per chart review,  Has waxed and waned over the last decade. Patient denies any frequent or recent infection. Likely benign and her baseline. Will continue to monitor.    PLAN -Mammogram on 07/02/2020 -Return in 1 year for f/u with Dr. Burr Medico -Patient will call if she decides to pursue adjuvant AI  No problem-specific Assessment & Plan notes found for this encounter.   No orders of the defined types were placed in this encounter.    Cassandra L Heilingoetter, PA-C 01/31/20  Addendum  I have seen the patient, examined her. I agree with the assessment and and plan and have edited the notes.   Katrinia is clinically doing well, I exam her breast, the tiny firm subcutaneous nodule above the left breast incision is likely corresponding to the fat necrosis on the mammogram.  No clinical suspicion for breast cancer.  I encouraged her to consider chemoprevention.  She will think about it.  We also reviewed her lab results, mild anemia is probably related to sickle cell trait,  and a mild leukopenia is probably related to her ethnicity, no concerns at this point, continue monitoring.   Truitt Merle  01/31/2020

## 2020-02-03 ENCOUNTER — Telehealth: Payer: Self-pay | Admitting: Hematology

## 2020-02-03 NOTE — Telephone Encounter (Signed)
Scheduled per los. Called and left msg. Mailed printout  °

## 2020-07-03 ENCOUNTER — Other Ambulatory Visit: Payer: Self-pay | Admitting: Nurse Practitioner

## 2020-07-03 ENCOUNTER — Other Ambulatory Visit: Payer: Self-pay

## 2020-07-03 ENCOUNTER — Ambulatory Visit
Admission: RE | Admit: 2020-07-03 | Discharge: 2020-07-03 | Disposition: A | Discharge status: Home / Self Care | Payer: Managed Care, Other (non HMO) | Source: Ambulatory Visit | Attending: Nurse Practitioner | Admitting: Nurse Practitioner

## 2020-07-03 DIAGNOSIS — Z9889 Other specified postprocedural states: Secondary | ICD-10-CM

## 2020-07-21 ENCOUNTER — Telehealth: Payer: Self-pay | Admitting: *Deleted

## 2020-07-21 NOTE — Telephone Encounter (Signed)
LM to call Dr Feng's nurse 

## 2020-07-21 NOTE — Telephone Encounter (Signed)
Notified of message below.  She DOES want to have the MRI along with next mammogram

## 2020-07-21 NOTE — Telephone Encounter (Signed)
-----   Message from Alla Feeling, NP sent at 07/20/2020 10:49 AM EDT ----- I tried to call her to review mammogram. Please let her know it looks good, no evidence of malignancy. The calcification at the lumpectomy site is not seen anymore. Due to her history of DCIS, the fact that she is not on antiestrogen therapy, dense breast tissue she is a candidate for screening breast MRI in addition to mammogram. If she would like [place] to order please let me know. Next mammogram in 12/2020 and f/u with [place] in 01/2021.   Thanks, Regan Rakers

## 2020-07-23 ENCOUNTER — Emergency Department (HOSPITAL_COMMUNITY): Payer: Managed Care, Other (non HMO)

## 2020-07-23 ENCOUNTER — Observation Stay (HOSPITAL_BASED_OUTPATIENT_CLINIC_OR_DEPARTMENT_OTHER)
Admission: EM | Admit: 2020-07-23 | Discharge: 2020-07-24 | DRG: 064 | Disposition: A | Discharge status: Home / Self Care | Payer: Managed Care, Other (non HMO) | Attending: Emergency Medicine | Admitting: Emergency Medicine

## 2020-07-23 ENCOUNTER — Encounter (HOSPITAL_BASED_OUTPATIENT_CLINIC_OR_DEPARTMENT_OTHER): Payer: Self-pay | Admitting: Emergency Medicine

## 2020-07-23 ENCOUNTER — Emergency Department (HOSPITAL_BASED_OUTPATIENT_CLINIC_OR_DEPARTMENT_OTHER): Payer: Managed Care, Other (non HMO)

## 2020-07-23 ENCOUNTER — Other Ambulatory Visit: Payer: Self-pay

## 2020-07-23 DIAGNOSIS — R519 Headache, unspecified: Secondary | ICD-10-CM | POA: Insufficient documentation

## 2020-07-23 DIAGNOSIS — E785 Hyperlipidemia, unspecified: Secondary | ICD-10-CM | POA: Diagnosis not present

## 2020-07-23 DIAGNOSIS — I639 Cerebral infarction, unspecified: Principal | ICD-10-CM | POA: Insufficient documentation

## 2020-07-23 DIAGNOSIS — I10 Essential (primary) hypertension: Secondary | ICD-10-CM | POA: Diagnosis not present

## 2020-07-23 DIAGNOSIS — G459 Transient cerebral ischemic attack, unspecified: Secondary | ICD-10-CM

## 2020-07-23 DIAGNOSIS — R110 Nausea: Secondary | ICD-10-CM | POA: Diagnosis not present

## 2020-07-23 DIAGNOSIS — Z20822 Contact with and (suspected) exposure to covid-19: Secondary | ICD-10-CM | POA: Diagnosis not present

## 2020-07-23 DIAGNOSIS — E119 Type 2 diabetes mellitus without complications: Secondary | ICD-10-CM | POA: Diagnosis not present

## 2020-07-23 DIAGNOSIS — Z7982 Long term (current) use of aspirin: Secondary | ICD-10-CM | POA: Insufficient documentation

## 2020-07-23 DIAGNOSIS — Z79899 Other long term (current) drug therapy: Secondary | ICD-10-CM | POA: Insufficient documentation

## 2020-07-23 DIAGNOSIS — R42 Dizziness and giddiness: Secondary | ICD-10-CM | POA: Diagnosis present

## 2020-07-23 LAB — COMPREHENSIVE METABOLIC PANEL
ALT: 12 U/L (ref 0–44)
AST: 21 U/L (ref 15–41)
Albumin: 4.3 g/dL (ref 3.5–5.0)
Alkaline Phosphatase: 71 U/L (ref 38–126)
Anion gap: 10 (ref 5–15)
BUN: 8 mg/dL (ref 6–20)
CO2: 26 mmol/L (ref 22–32)
Calcium: 9.2 mg/dL (ref 8.9–10.3)
Chloride: 102 mmol/L (ref 98–111)
Creatinine, Ser: 0.67 mg/dL (ref 0.44–1.00)
GFR calc Af Amer: >60 mL/min (ref >60)
GFR calc non Af Amer: >60 mL/min (ref >60)
Glucose, Bld: 111 mg/dL — ABNORMAL HIGH (ref 70–99)
Potassium: 4.2 mmol/L (ref 3.5–5.1)
Sodium: 138 mmol/L (ref 135–145)
Total Bilirubin: 0.5 mg/dL (ref 0.3–1.2)
Total Protein: 7.5 g/dL (ref 6.5–8.1)

## 2020-07-23 LAB — CBC
HCT: 39.9 % (ref 36.0–46.0)
Hemoglobin: 13.1 g/dL (ref 12.0–15.0)
MCH: 29.5 pg (ref 26.0–34.0)
MCHC: 32.8 g/dL (ref 30.0–36.0)
MCV: 89.9 fL (ref 80.0–100.0)
Platelets: 190 10*3/uL (ref 150–400)
RBC: 4.44 MIL/uL (ref 3.87–5.11)
RDW: 14.6 % (ref 11.5–15.5)
WBC: 3.1 10*3/uL — ABNORMAL LOW (ref 4.0–10.5)
nRBC: 0 % (ref 0.0–0.2)

## 2020-07-23 LAB — SARS CORONAVIRUS 2 BY RT PCR (HOSPITAL ORDER, PERFORMED IN ~~LOC~~ HOSPITAL LAB): SARS Coronavirus 2: NEGATIVE

## 2020-07-23 LAB — TROPONIN I (HIGH SENSITIVITY): Troponin I (High Sensitivity): 6 ng/L (ref <18)

## 2020-07-23 LAB — PROTIME-INR
INR: 0.8 (ref 0.8–1.2)
Prothrombin Time: 11.2 seconds — ABNORMAL LOW (ref 11.4–15.2)

## 2020-07-23 LAB — APTT: aPTT: 27 seconds (ref 24–36)

## 2020-07-23 LAB — MRSA PCR SCREENING: MRSA by PCR: NEGATIVE

## 2020-07-23 LAB — DIFFERENTIAL
Abs Immature Granulocytes: 0.01 10*3/uL (ref 0.00–0.07)
Basophils Absolute: 0 10*3/uL (ref 0.0–0.1)
Basophils Relative: 0 %
Eosinophils Absolute: 0 10*3/uL (ref 0.0–0.5)
Eosinophils Relative: 1 %
Immature Granulocytes: 0 %
Lymphocytes Relative: 31 %
Lymphs Abs: 1 10*3/uL (ref 0.7–4.0)
Monocytes Absolute: 0.2 10*3/uL (ref 0.1–1.0)
Monocytes Relative: 6 %
Neutro Abs: 1.9 10*3/uL (ref 1.7–7.7)
Neutrophils Relative %: 62 %

## 2020-07-23 LAB — CBG MONITORING, ED: Glucose-Capillary: 85 mg/dL (ref 70–99)

## 2020-07-23 LAB — GLUCOSE, CAPILLARY: Glucose-Capillary: 99 mg/dL (ref 70–99)

## 2020-07-23 MED ORDER — ALTEPLASE 100 MG IV SOLR
INTRAVENOUS | Status: AC
  Filled 2020-07-23: qty 100

## 2020-07-23 MED ORDER — SODIUM CHLORIDE 0.9 % IV SOLN: 50.0000 mL | Freq: Once | INTRAVENOUS | Status: DC

## 2020-07-23 MED ORDER — PANTOPRAZOLE SODIUM 40 MG IV SOLR
40.0000 mg | Freq: Every day | INTRAVENOUS | Status: DC
  Administered 2020-07-23: 40 mg via INTRAVENOUS
  Filled 2020-07-23: qty 40

## 2020-07-23 MED ORDER — LABETALOL HCL 5 MG/ML IV SOLN: 20.0000 mg | Freq: Once | INTRAVENOUS | Status: DC

## 2020-07-23 MED ORDER — ACETAMINOPHEN 325 MG PO TABS
650.0000 mg | ORAL_TABLET | ORAL | Status: DC | PRN
  Administered 2020-07-23: 650 mg via ORAL
  Filled 2020-07-23: qty 2

## 2020-07-23 MED ORDER — ACETAMINOPHEN 160 MG/5ML PO SOLN: 650.0000 mg | ORAL | Status: DC | PRN

## 2020-07-23 MED ORDER — CHLORHEXIDINE GLUCONATE CLOTH 2 % EX PADS
6.0000 | MEDICATED_PAD | Freq: Every day | CUTANEOUS | Status: DC
  Administered 2020-07-23: 6 via TOPICAL

## 2020-07-23 MED ORDER — ALTEPLASE (STROKE) FULL DOSE INFUSION
0.9000 mg/kg | Freq: Once | INTRAVENOUS | Status: DC
  Filled 2020-07-23: qty 100

## 2020-07-23 MED ORDER — CLEVIDIPINE BUTYRATE 0.5 MG/ML IV EMUL: 0.0000 mg/h | INTRAVENOUS | Status: DC

## 2020-07-23 MED ORDER — SENNOSIDES-DOCUSATE SODIUM 8.6-50 MG PO TABS
1.0000 | ORAL_TABLET | Freq: Every evening | ORAL | Status: DC | PRN
  Filled 2020-07-23: qty 1

## 2020-07-23 MED ORDER — IOHEXOL 350 MG/ML SOLN: 100.0000 mL | Freq: Once | INTRAVENOUS | Status: DC | PRN

## 2020-07-23 MED ORDER — ALTEPLASE (STROKE) FULL DOSE INFUSION
0.9000 mg/kg | Freq: Once | INTRAVENOUS | Status: AC
  Administered 2020-07-23: 64.8 mg via INTRAVENOUS
  Filled 2020-07-23: qty 100

## 2020-07-23 MED ORDER — SODIUM CHLORIDE 0.9 % IV SOLN: INTRAVENOUS | Status: DC

## 2020-07-23 MED ORDER — ACETAMINOPHEN 650 MG RE SUPP: 650.0000 mg | RECTAL | Status: DC | PRN

## 2020-07-23 MED ORDER — SODIUM CHLORIDE 0.9 % IV SOLN
50.0000 mL | Freq: Once | INTRAVENOUS | Status: AC
  Administered 2020-07-23: 50 mL via INTRAVENOUS

## 2020-07-23 MED ORDER — STROKE: EARLY STAGES OF RECOVERY BOOK
Freq: Once | Status: DC
  Filled 2020-07-23: qty 1

## 2020-07-23 NOTE — ED Notes (Signed)
Pt was at work when she had a sudden onset of dizziness and felt like she was going to pass out at 9am. She felt normal when she woke up this morning.

## 2020-07-23 NOTE — ED Triage Notes (Signed)
Per EMS:  Dizziness since 9am.  Some sob at time.  No neurological deficits.  Upset about her glasses.  Dizziness worsened with riding backwards in ambulance.

## 2020-07-23 NOTE — ED Notes (Signed)
Pt transported to MRI on monitor w/ RN

## 2020-07-23 NOTE — H&P (Addendum)
Neurology history and physical   History is obtained from: chart and patient  HPI: Deanna Roberts is a 54 y.o. female with history of hypertension, diabetes, cancer and CAD.  At 9 AM while at work she suddenly developed left leg incoordination, heaviness in her left arm and dizziness.  It was noted at [facility] that left lower extremity ataxia and drift along with lethargy, per teleneurology.  Patient received tPA and then was transferred to [facility] as CTA was unable to be obtained due to infiltration of contrast.  On arrival patient was alert and oriented, continue to show ataxia especially in the left upper and lower extremities.  She also stated that she felt her voice was slurred.  ED course-med [facility] Relevant labs include -WBC 3.1  CT head shows-no acute intracranial pathology.  Small vessel white matter disease  LKW: 0900 tpa given?:  Yes @ 12:45 PM Premorbid modified Rankin scale (mRS): 0  NIHSS 1a Level of Conscious.: 0 1b LOC Questions: 0 1c LOC Commands: 0 2 Best Gaze: 0 3 Visual: 0 4 Facial Palsy: 1 5a Motor Arm - left: 0 5b Motor Arm - Right: 0 6a Motor Leg - Left: 0 6b Motor Leg - Right: 0 7 Limb Ataxia: 2 8 Sensory: 0 9 Best Language: 0 10 Dysarthria: 0 11 Extinct. and Inatten.: 0 TOTAL: 3   Past Medical History:  Diagnosis Date  . Cancer ([place])   . Coronary artery disease   . Diabetes mellitus without complication ([place])   . History of radiation therapy 05/30/18- 07/04/18   Left Breast 40.05 Gy in 15 fractions, Left Breast boost 5 Gy in 5 fractions.   . Hypertension   . Myocardial infarction [facility]) 2013    Family History  Problem Relation Age of Onset  . Heart attack Mother   . Breast cancer Mother 21  . Multiple sclerosis Sister   . Multiple sclerosis Sister    Social History:   reports that she has never smoked. She has never used smokeless tobacco. She reports current alcohol use. She reports that she does not use  drugs.  Medications  Current Facility-Administered Medications:  .   stroke: mapping our early stages of recovery book, , Does not apply, Once, Marliss Coots, PA-C .  0.9 %  sodium chloride infusion, , Intravenous, Continuous, Marliss Coots, PA-C .  acetaminophen (TYLENOL) tablet 650 mg, 650 mg, Oral, Q4H PRN **OR** acetaminophen (TYLENOL) 160 MG/5ML solution 650 mg, 650 mg, Per Tube, Q4H PRN **OR** acetaminophen (TYLENOL) suppository 650 mg, 650 mg, Rectal, Q4H PRN, Marliss Coots, PA-C .  alteplase (ACTIVASE) 1 mg/mL injection, , , ,  .  labetalol (NORMODYNE) injection 20 mg, 20 mg, Intravenous, Once **AND** clevidipine (CLEVIPREX) infusion 0.5 mg/mL, 0-21 mg/hr, Intravenous, Continuous, Marliss Coots, PA-C .  iohexol (OMNIPAQUE) 350 MG/ML injection 100 mL, 100 mL, Intravenous, Once PRN, Charlesetta Shanks, MD .  pantoprazole (PROTONIX) injection 40 mg, 40 mg, Intravenous, QHS, Smith, David R, PA-C .  senna-docusate (Senokot-S) tablet 1 tablet, 1 tablet, Oral, QHS PRN, Marliss Coots, PA-C  Current Outpatient Medications:  .  aspirin 81 MG chewable tablet, Chew 81 mg by mouth daily., Disp: , Rfl:  .  ezetimibe (ZETIA) 10 MG tablet, , Disp: , Rfl: 4 .  hydrochlorothiazide (HYDRODIURIL) 25 MG tablet, Take 25 mg by mouth daily., Disp: , Rfl:  .  HYDROcodone-acetaminophen (NORCO/VICODIN) 5-325 MG tablet, Take 1 tablet by mouth every 6 (six)  hours as needed for moderate pain. Prescribed by Dr. Clayborne Dana with [facility]., Disp: , Rfl:  .  lisinopril (PRINIVIL,ZESTRIL) 10 MG tablet, Take 10 mg by mouth daily., Disp: , Rfl:  .  metroNIDAZOLE (FLAGYL) 500 MG tablet, Take 1 tablet (500 mg total) by mouth 2 (two) times daily., Disp: 14 tablet, Rfl: 0  ROS:    General ROS: negative for - chills, fatigue, fever, night sweats, weight gain or weight loss Psychological ROS: negative for - behavioral disorder, hallucinations, memory difficulties, mood swings or suicidal ideation Ophthalmic  ROS: negative for - blurry vision, double vision, eye pain or loss of vision ENT ROS: negative for - epistaxis, nasal discharge, oral lesions, sore throat, tinnitus or vertigo Allergy and Immunology ROS: negative for - hives or itchy/watery eyes Hematological and Lymphatic ROS: negative for - bleeding problems, bruising or swollen lymph nodes Endocrine ROS: negative for - galactorrhea, hair pattern changes, polydipsia/polyuria or temperature intolerance Respiratory ROS: negative for - cough, hemoptysis, shortness of breath or wheezing Cardiovascular ROS: negative for - chest pain, dyspnea on exertion, edema or irregular heartbeat Gastrointestinal ROS: negative for - abdominal pain, diarrhea, hematemesis, nausea/vomiting or stool incontinence Genito-Urinary ROS: negative for - dysuria, hematuria, incontinence or urinary frequency/urgency Musculoskeletal ROS: Positive for -muscular weakness Neurological ROS: as noted in HPI Dermatological ROS: negative for rash and skin lesion changes  Exam: Current vital signs: BP 130/75 (BP Location: Right Arm)   Pulse 72   Temp 97.6 F (36.4 C)   Resp 17   Ht 5\' 7"  (1.702 m)   Wt 72 kg   SpO2 100%   BMI 24.87 kg/m  Vital signs in last 24 hours: Temp:  [97.6 F (36.4 C)] 97.6 F (36.4 C) (08/12 1118) Pulse Rate:  [67-83] 72 (08/12 1450) Resp:  [13-20] 17 (08/12 1450) BP: (108-134)/(54-78) 130/75 (08/12 1450) SpO2:  [100 %] 100 % (08/12 1450) Weight:  [68 kg-72 kg] 72 kg (08/12 1451)   Constitutional: Appears well-developed and well-nourished.  Psych: Affect appropriate to situation Eyes: No scleral injection HENT: No OP obstrucion Head: Normocephalic.  Cardiovascular: Normal rate and regular rhythm.  Respiratory: Effort normal, non-labored breathing GI: Soft.  No distension. There is no tenderness.  Skin: WDI  Neuro: Mental Status: Patient is awake and alert, oriented to person and place, speech slightly dysarthric but not aphasic,  able to follow commands without difficulty. Cranial Nerves: II: Visual Fields are full.  III,IV, VI: EOMI without ptosis or diploplia. Pupils equal, round and reactive to light V: Facial sensation is symmetric to temperature VII: Slight right facial droop.  VIII: hearing is intact to voice X: Palat elevates symmetrically XI: Shoulder shrug is symmetric. XII: tongue is midline without atrophy or fasciculations.  Motor: Tone is normal. Bulk is normal. 5/5 strength was present in all four extremities.  No drift Sensory: Sensation is symmetric to light touch and temperature in the arms and legs. DSS intact Deep Tendon Reflexes: 2+ and symmetric in the biceps and patellae.  Plantars: Toes are downgoing bilaterally.  Cerebellar: Significant dysmetria with finger-nose-finger and heel-to-shin on the left.  Mild dysmetria with finger-nose-finger on the right  Labs I have reviewed labs in epic and the results pertinent to this consultation are:   CBC    Component Value Date/Time   WBC 3.1 (L) 07/23/2020 1157   RBC 4.44 07/23/2020 1157   HGB 13.1 07/23/2020 1157   HGB 11.4 (L) 01/31/2020 1308   HGB 11.4 (L) 01/03/2008 1549  HCT 39.9 07/23/2020 1157   HCT 33.5 (L) 01/03/2008 1549   PLT 190 07/23/2020 1157   PLT 169 01/31/2020 1308   PLT 193 01/03/2008 1549   MCV 89.9 07/23/2020 1157   MCV 91.8 01/03/2008 1549   MCH 29.5 07/23/2020 1157   MCHC 32.8 07/23/2020 1157   RDW 14.6 07/23/2020 1157   RDW 13.7 01/03/2008 1549   LYMPHSABS 1.0 07/23/2020 1157   LYMPHSABS 1.5 01/03/2008 1549   MONOABS 0.2 07/23/2020 1157   MONOABS 0.3 01/03/2008 1549   EOSABS 0.0 07/23/2020 1157   EOSABS 0.0 01/03/2008 1549   BASOSABS 0.0 07/23/2020 1157   BASOSABS 0.0 01/03/2008 1549    CMP     Component Value Date/Time   NA 138 07/23/2020 1157   K 4.2 07/23/2020 1157   CL 102 07/23/2020 1157   CO2 26 07/23/2020 1157   GLUCOSE 111 (H) 07/23/2020 1157   GLUCOSE 84 09/21/2006 0721   BUN 8  07/23/2020 1157   CREATININE 0.67 07/23/2020 1157   CREATININE 0.73 01/31/2020 1308   CALCIUM 9.2 07/23/2020 1157   PROT 7.5 07/23/2020 1157   ALBUMIN 4.3 07/23/2020 1157   AST 21 07/23/2020 1157   AST 16 01/31/2020 1308   ALT 12 07/23/2020 1157   ALT 8 01/31/2020 1308   ALKPHOS 71 07/23/2020 1157   BILITOT 0.5 07/23/2020 1157   BILITOT 0.3 01/31/2020 1308   GFRNONAA >60 07/23/2020 1157   GFRNONAA >60 01/31/2020 1308   GFRAA >60 07/23/2020 1157   GFRAA >60 01/31/2020 1308    Lipid Panel     Component Value Date/Time   CHOL 129 11/10/2008 0940   TRIG 33 11/10/2008 0940   TRIG 35 09/21/2006 0721   HDL 53.8 11/10/2008 0940   CHOLHDL 2.4 CALC 11/10/2008 0940   VLDL 7 11/10/2008 0940   LDLCALC 69 11/10/2008 0940     Imaging I have reviewed the images obtained:  CT-scan of the brain-no acute abnormalities   Etta Quill PA-C [facility] [phone]  M-F  (9:00 am- 5:00 PM)  07/23/2020, 3:06 PM     Assessment:  54 year old female transferred from [place] secondary to code stroke and receiving TPA.  Unfortunately patient was unable to obtain CTA of head and neck.  While in the ED at [facility] bilateral IVs infiltrated thus patient had to be urgently rushed to the MRI to obtain a MRA of brain to evaluate for possible basilar artery occlusion.  Exam as above.   Impression: -Stroke  Plan: Acuity: Acute -Admit to: ICU -Continue Statin -Hold Aspirin until 24 hour post tPA neuroimaging is stable and without evidence of bleeding -Blood pressure control, goal of SYS <180 -MRI/ECHO/A1C/Lipid panel. -Hyperglycemia management per SSI to maintain glucose 140-180mg /dL. -PT/OT/ST therapies and recommendations when able  CV -Aggressive BP control, goal SBP < 180 -Titrate oral agents.  If unable to control blood pressure may add IV agents.  Heart failure, unspecified Acute systolic (congestive) heart failure  Chronic systolic (congestive) heart  failure  Acute on chronic systolic (congestive) heart failure  -TTE -Continue BB -Cards Consult   Hyperlipidemia, unspecified  - Statin for goal LDL < 70  ENDO -SSI -goal HgbA1c < 7   Fluid/Electrolyte Disorders Gently hydrate at 75 cc/h  Prophylaxis DVT: SCD GI: Senokot Bowel: Protonix  Diet: NPO until cleared by speech  Code Status: Full Code    Attending attestation: I personally examined the patient, gathered history, reviewed labs and imaging.  Patient presents with acute  onset difficulty controlling the left side of her body while she was at work.  She presented within the TPA window to [place] where TPA was administered.  Unfortunately it is unclear whether the TPA IV may have infiltrated; IV used for CTA also infiltrated.  Therefore upon her arrival to [facility], ED we performed MRA to exclude a basilar artery thrombosis given her symptoms concerning for posterior circulation stroke with localization to the left cerebellum or left cerebellar output, as well as the potential top of the basilar syndrome due to her somnolence.  On my personal review of the imaging, her basilar is open, but I do think that I see some subtle infarcts in the left pontocerebellar junction that likely explain her clinical exam.  Her clinical exam is notable only for somnolence, left >>> right dysmetria (both arm and leg), and a very mild right facial droop; I do not appreciate dysarthria.  She requires admission for stroke work-up including echocardiogram, lipid panel, A1c, telemetry, as well as post TPA monitoring.  Lesleigh Noe MD-PhD [facility] [phone]

## 2020-07-23 NOTE — Procedures (Signed)
Echo attempted. Patient not in room at time of attempt. Will attempt again later.

## 2020-07-23 NOTE — ED Notes (Signed)
Attempted to call report to ED charge.

## 2020-07-23 NOTE — ED Notes (Signed)
Patient transported to CT 

## 2020-07-23 NOTE — Consult Note (Addendum)
[facility] Tele[facility]urology Consult Services   Date of Service:   07/23/2020 12:10:39  Impression:     .  R27.0 - Ataxia (unsp)  Comments/Sign-Out: 61 F, h/o HTN and HL who developed sudden onset L leg incoordination, heaviness/tension in her head and neck, and dizziness. NIHSS 3 for LLE ataxia and drift and mild lethargy. She consented to alteplase treatment for suspected stroke. Head CT neg for acute abnl.   PLAN  - admit to ICU for post alteplase care  - strict SBP<180 for next 24 hrs  - MRI brain w/o contrast  - head CT in 24 hrs to rule out bleed  - TTE w/bubble  - check a1c and LDL  - monitor on tele for afib  - neuro to follow  --  Metrics: Last Known Well: 07/23/2020 09:00:00 [facility] Notification Time: 07/23/2020 12:10:39 Arrival Time: 07/23/2020 11:10:00 Stamp Time: 07/23/2020 12:10:39 Time First Login Attempt: 07/23/2020 12:15:37 Symptoms: L leg incoordination and heaviness in her head. NIHSS Start Assessment Time: 07/23/2020 12:20:09 Thrombolytic Early Mix Decision Time: 07/23/2020 12:26:51 Patient is a candidate for Thrombolytic. Thrombolytic Medical Decision: 07/23/2020 12:26:50 Thrombolytic CPOE Order Time: 07/23/2020 12:30:00 Needle Time: 07/23/2020 12:45:00 Weight Noted by Staff: 158.8 lbs Reason for Thrombolytic Delay: Absence of IV access at the time of MDM  CT head showed no acute hemorrhage or acute core infarct.  ED Physician notified of diagnostic impression and management plan on 07/23/2020 12:50:25  Thrombolytic Contraindications:  Last Known Well > 4.5 hours: No CT Head showing hemorrhage: No Ischemic stroke within 3 months: No Severe head trauma within 3 months: No Intracranial/intraspinal surgery within 3 months: No History of intracranial hemorrhage: No Symptoms and signs consistent with an SAH: No GI malignancy or GI bleed within 21 days: No Coagulopathy: Platelets <100 000/mm3, INR >1.7, aPTT>40 s, or PT >15  s: No Treatment dose of LMWH within the previous 24 hrs: No Use of NOACs in past 48 hours: No Glycoprotein IIb/IIIa receptor inhibitors use: No Symptoms consistent with infective endocarditis: No Suspected aortic arch dissection: No Intra-axial intracranial neoplasm: No  Verbal Consent to Thrombolytic: I have explained to the Patient the nature of the patient's condition, reviewed the indications and contraindications to the use of Thrombolytic fibrinolytic agent, reviewed the indications and contraindications and the benefits to be reasonably expected compared with alternative approaches. I have discussed the likelihood of major risks or complications of this procedure including (if applicable) but not limited to loss of limb function, brain damage, paralysis, hemorrhage, infection, complications from transfusion of blood components, drug reactions, blood clots and loss of life. I have also indicated that with any procedure there is always the possibility of an unexpected complication. All questions were answered and the Patient expressed understanding of the treatment plan and consent to the treatment.  Our recommendations are outlined below.  Recommendations: IV Alteplase recommended.  Thrombolytic bolus given Without Complication.   IV Alteplase/Activase Total Dose - 64.8 mg IV Alteplase/Activase Bolus Dose - 6.5 mg IV Alteplase/Activase Infusion Dose - 58.3 mg   Routine post Thrombolytic monitoring including neuro checks and blood pressure control during/after treatment Monitor blood pressure Check blood pressure and neuro assessment every 15 min for 2 h, then every 30 min for 6 h, and finally every hour for 16 h.  Manage Blood Pressure per post Thrombolytic protocol.      .  Admission to ICU     .  CT brain 24 hours post Thrombolytic     .  NPO  until swallowing screen performed and passed     .  No antiplatelet agents or anticoagulants (including heparin for DVT prophylaxis) in  first 24 hours     .  No Foley catheter, nasogastric tube, arterial catheter or central venous catheter for 24 hr, unless absolutely necessary     .  Telemetry     .  Bedside swallow evaluation     .  HOB less than 30 degrees     .  Euglycemia     .  Avoid hyperthermia, PRN acetaminophen     .  DVT prophylaxis     .  Inpatient Neurology Consultation     .  Stroke evaluation as per inpatient neurology recommendations  Discussed with ED physician  ------------------------------------------------------------------------------  History of Present Illness: Patient is a 54 year old Female.  Patient was brought by private transportation with symptoms of L leg incoordination and heaviness in her head.  65 F, h/o HTN and HL, who was LKW at 9 AM today when she was at work. She developed sudden onset dizziness, heaviness and tension in her head and neck, and incoordination of her L leg. Came to ER for eval. NIHSS 3 for L leg HTS ataxia and LLE drift, and mild lethargy. No history of stroke in the past. No current headache. No history of ICH, not on blood thinners. No recent surgery or head trauma.   Examination: BP(134/68), Pulse(82), Blood Glucose(85) 1A: Level of Consciousness - Arouses to minor stimulation + 1 1B: Ask Month and Age - Both Questions Right + 0 1C: Blink Eyes & Squeeze Hands - Performs Both Tasks + 0 2: Test Horizontal Extraocular Movements - Normal + 0 3: Test Visual Fields - No Visual Loss + 0 4: Test Facial Palsy (Use Grimace if Obtunded) - Normal symmetry + 0 5A: Test Left Arm Motor Drift - No Drift for 10 Seconds + 0 5B: Test Right Arm Motor Drift - No Drift for 10 Seconds + 0 6A: Test Left Leg Motor Drift - Drift, but doesn't hit bed + 1 6B: Test Right Leg Motor Drift - No Drift for 5 Seconds + 0 7: Test Limb Ataxia (FNF/Heel-Shin) - Ataxia in 1 Limb + 1 8: Test Sensation - Normal; No sensory loss + 0 9: Test Language/Aphasia - Normal; No aphasia + 0 10: Test  Dysarthria - Normal + 0 11: Test Extinction/Inattention - No abnormality + 0  NIHSS Score: 3  Pre-Morbid Modified Rankin Scale: 0 Points = No symptoms at all   Patient/Family was informed the Neurology Consult would occur via TeleHealth consult by way of interactive audio and video telecommunications and consented to receiving care in this manner.   Patient is being evaluated for possible acute neurologic impairment and high probability of imminent or life-threatening deterioration. I spent total of 30 minutes providing care to this patient, including time for face to face visit via telemedicine, review of medical records, imaging studies and discussion of findings with providers, the patient and/or family.   Dr Burtis Junes   [facility] [phone]  Case [id number]

## 2020-07-23 NOTE — ED Notes (Signed)
[place] IV attempted by RN without success.

## 2020-07-23 NOTE — ED Notes (Addendum)
CT confirmed w/ [facility] that tPA was given in pt's R AC and contrast given in L AC. When CT flushed saline in R AC in prep for CTA, IV was blown. Unknown when line initally infiltrated. Line was infusing upon arrival w/ NS.

## 2020-07-23 NOTE — ED Notes (Signed)
Dr. Johnney Killian calling a code stroke.

## 2020-07-23 NOTE — ED Notes (Signed)
Pt transported to CT on monitor w/ RN

## 2020-07-23 NOTE — ED Provider Notes (Addendum)
[facility] EMERGENCY DEPARTMENT Provider Note   CSN: [id number] Arrival date & time: 07/23/20  1110     History Chief Complaint  Patient presents with  . Dizziness    Deanna Roberts is a 54 y.o. female.  HPI Patient reports that she awakened normally at 5 AM to get ready for work.  She was not having any symptoms.  She had been at work for about an hour and then developed sudden onset of lightheadedness, nausea and feeling like her vision was going dark.  She denies that there was a spinning quality.  She reports the lightheadedness more of a quality like she would pass out.  She describes the visual problem as her vision going dark almost like tunnel vision.  No loss of vision or double vision endorsed.  No focal weakness numbness or tingling.  She reports she feels generally weak all over.  She had some chest pressure.  Symptoms have persisted during the ambulance ride and she felt that riding backwards made the quality of dizziness worse.  Reexamination 12: 04 while performing motor strength testing, patient subjectively feels that the left side is slightly weaker.    Past Medical History:  Diagnosis Date  . Cancer ([place])   . Coronary artery disease   . Diabetes mellitus without complication ([place])   . History of radiation therapy 05/30/18- 07/04/18   Left Breast 40.05 Gy in 15 fractions, Left Breast boost 5 Gy in 5 fractions.   . Hypertension   . Myocardial infarction [facility]) 2013    Patient Active Problem List   Diagnosis Date Noted  . Stroke (cerebrum) ([place]) 07/23/2020  . Carcinoma of upper-outer quadrant of left breast in female, estrogen receptor positive ([place]) 01/30/2018  . 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 FEVER UNSPECIFIED 03/17/2009  . ANEMIA-IRON DEFICIENCY 10/08/2007  . SICKLE CELL TRAIT 10/08/2007  . DISEASE, SICKLE CELL W/CRISIS NEC 10/08/2007  . HYPERTENSION 10/08/2007  . GLUCOSE INTOLERANCE, HX OF 10/08/2007    Past  Surgical History:  Procedure Laterality Date  . ABDOMINAL HYSTERECTOMY    . BREAST LUMPECTOMY Left 2019  . BREAST LUMPECTOMY WITH RADIOACTIVE SEED LOCALIZATION Left 03/22/2018   Procedure: LEFT BREAST LUMPECTOMY WITH BRACKETED RADIOACTIVE SEED LOCALIZATION;  Surgeon: Erroll Luna, MD;  Location: [facility];  Service: General;  Laterality: Left;     OB History   No obstetric history on file.     Family History  Problem Relation Age of Onset  . Heart attack Mother   . Breast cancer Mother 56  . Multiple sclerosis Sister   . Multiple sclerosis Sister     Social History   Tobacco Use  . Smoking status: Never Smoker  . Smokeless tobacco: Never Used  . Tobacco comment: she smoked in college  Vaping Use  . Vaping Use: Never used  Substance Use Topics  . Alcohol use: Yes    Comment: occasional  . Drug use: No    Home Medications Prior to Admission medications   Medication Sig Start Date End Date Taking? Authorizing Provider  aspirin 81 MG chewable tablet Chew 81 mg by mouth daily.   Yes [provider]  ezetimibe (ZETIA) 10 MG tablet  01/25/18  Yes [provider]  hydrochlorothiazide (HYDRODIURIL) 25 MG tablet Take 25 mg by mouth daily.   Yes [provider]  HYDROcodone-acetaminophen (NORCO/VICODIN) 5-325 MG tablet Take 1 tablet by mouth every 6 (six) hours as needed for moderate pain. Prescribed  by Dr. Clayborne Dana with [facility].   Yes [provider]  lisinopril (PRINIVIL,ZESTRIL) 10 MG tablet Take 10 mg by mouth daily.   Yes [provider]  metroNIDAZOLE (FLAGYL) 500 MG tablet Take 1 tablet (500 mg total) by mouth 2 (two) times daily. 06/11/18   Virgel Manifold, MD    Allergies    Patient has no known allergies.  Review of Systems   Review of Systems 10 systems reviewed and negative except as per HPI Physical Exam Updated Vital Signs BP (!) 124/54   Pulse 72   Temp 97.6 F (36.4 C)   Resp 18    Ht 5\' 7"  (1.702 m)   Wt 72 kg   SpO2 100%   BMI 24.87 kg/m   Physical Exam Constitutional:      Comments: Patient appears fatigued and slightly ill.  No respiratory distress.  GCS 15.  HENT:     Head: Normocephalic and atraumatic.     Mouth/Throat:     Mouth: Mucous membranes are moist.     Pharynx: Oropharynx is clear.  Eyes:     Extraocular Movements: Extraocular movements intact.     Conjunctiva/sclera: Conjunctivae normal.     Pupils: Pupils are equal, round, and reactive to light.     Comments: Questionable subtle lid lag on the left which seems to correct quickly with repeat exam.  Cardiovascular:     Rate and Rhythm: Normal rate and regular rhythm.     Pulses: Normal pulses.     Heart sounds: Normal heart sounds.  Pulmonary:     Breath sounds: Normal breath sounds.  Abdominal:     General: There is no distension.     Palpations: Abdomen is soft.     Tenderness: There is no abdominal tenderness. There is no guarding.  Musculoskeletal:        General: No swelling or tenderness. Normal range of motion.     Cervical back: Neck supple.     Right lower leg: No edema.     Left lower leg: No edema.  Skin:    General: Skin is warm and dry.  Neurological:     Comments: Patient is alert and appropriate but appears slightly fatigued.  Cognitive functions intact.  No receptive or expressive aphasia. No visual field loss to confrontational visual check. No tongue deviation.  Cranial nerves intact. Motor strength grip 5\5, push pull 5\5.  With finger-nose examination bilateral upper extremity, subtle incoordination on the left. Motor testing lower extremity: Patient can elevate and hold both lower extremity off of the bed for greater than 5 seconds.  Left does seem to fatigue slightly and get some tremor. Sensation to light touch.  Patient endorses some degree of decreased sensation on the left lower extremity to light touch.     ED Results / Procedures / Treatments    Labs (all labs ordered are listed, but only abnormal results are displayed) Labs Reviewed  PROTIME-INR - Abnormal; Notable for the following components:      Result Value   Prothrombin Time 11.2 (*)    All other components within normal limits  CBC - Abnormal; Notable for the following components:   WBC 3.1 (*)    All other components within normal limits  COMPREHENSIVE METABOLIC PANEL - Abnormal; Notable for the following components:   Glucose, Bld 111 (*)    All other components within normal limits  SARS CORONAVIRUS 2 BY RT PCR (HOSPITAL ORDER, [place]  LAB)  APTT  DIFFERENTIAL  HIV ANTIBODY (ROUTINE TESTING W REFLEX)  CBG MONITORING, ED  TROPONIN I (HIGH SENSITIVITY)  TROPONIN I (HIGH SENSITIVITY)    EKG EKG Interpretation  Date/Time:  Thursday July 23 2020 11:22:18 EDT Ventricular Rate:  72 PR Interval:  152 QRS Duration: 80 QT Interval:  366 QTC Calculation: 400 R Axis:   61 Text Interpretation: Normal sinus rhythm normal, no change from previous Confirmed by Charlesetta Shanks [id number]) on 07/23/2020 11:35:03 AM   Radiology CT HEAD WO CONTRAST  Result Date: 07/23/2020 CLINICAL DATA:  Dizziness EXAM: CT HEAD WITHOUT CONTRAST TECHNIQUE: Contiguous axial images were obtained from the base of the skull through the vertex without intravenous contrast. COMPARISON:  None. FINDINGS: Brain: No evidence of acute infarction, hemorrhage, hydrocephalus, extra-axial collection or mass lesion/mass effect. Periventricular white matter hypodensity. Vascular: No hyperdense vessel or unexpected calcification. Skull: Normal. Negative for fracture or focal lesion. Sinuses/Orbits: No acute finding. Other: None. IMPRESSION: No acute intracranial pathology. Small-vessel white matter disease. Electronically Signed   By: Eddie Candle M.D.   On: 07/23/2020 11:51    Procedures Procedures (including critical care time)  2 peripheral ultrasound-guided IVs placed by  myself Angiocath insertion Performed by: Charlesetta Shanks  Consent: Verbal consent obtained. Risks and benefits: risks, benefits and alternatives were discussed Time out: Immediately prior to procedure a "time out" was called to verify the correct patient, procedure, equipment, support staff and site/side marked as required.  Preparation: Patient was prepped and draped in the usual sterile fashion.  Vein Location: rt basilic  Yes Ultrasound Guided  Gauge: 20 long  Normal blood return and flush without difficulty Patient tolerance: Patient tolerated the procedure well with no immediate complications.  Angiocath insertion Performed by: Charlesetta Shanks  Consent: Verbal consent obtained. Risks and benefits: risks, benefits and alternatives were discussed Time out: Immediately prior to procedure a "time out" was called to verify the correct patient, procedure, equipment, support staff and site/side marked as required.  Preparation: Patient was prepped and draped in the usual sterile fashion.  Vein Location: left basilic  Yes Ultrasound Guided  Gauge: 20 long   Normal blood return and flush without difficulty Patient tolerance: Patient tolerated the procedure well with no immediate complications.  CRITICAL CARE Performed by: Charlesetta Shanks   Total critical care time: 45 minutes  Critical care time was exclusive of separately billable procedures and treating other patients.  Critical care was necessary to treat or prevent imminent or life-threatening deterioration.  Critical care was time spent personally by me on the following activities: development of treatment plan with patient and/or surrogate as well as nursing, discussions with consultants, evaluation of patient's response to treatment, examination of patient, obtaining history from patient or surrogate, ordering and performing treatments and interventions, ordering and review of laboratory studies, ordering and review of  radiographic studies, pulse oximetry and re-evaluation of patient's condition.     Medications Ordered in ED Medications  alteplase (ACTIVASE) 1 mg/mL infusion 64.8 mg (64.8 mg Intravenous New Bag/Given 07/23/20 1245)    Followed by  0.9 %  sodium chloride infusion (has no administration in time range)  alteplase (ACTIVASE) 1 mg/mL injection (has no administration in time range)  iohexol (OMNIPAQUE) 350 MG/ML injection 100 mL (has no administration in time range)   stroke: mapping our early stages of recovery book (has no administration in time range)  0.9 %  sodium chloride infusion (has no administration in time range)  acetaminophen (TYLENOL) tablet 650 mg (has  no administration in time range)    Or  acetaminophen (TYLENOL) 160 MG/5ML solution 650 mg (has no administration in time range)    Or  acetaminophen (TYLENOL) suppository 650 mg (has no administration in time range)  senna-docusate (Senokot-S) tablet 1 tablet (has no administration in time range)  pantoprazole (PROTONIX) injection 40 mg (has no administration in time range)  labetalol (NORMODYNE) injection 20 mg (has no administration in time range)    And  clevidipine (CLEVIPREX) infusion 0.5 mg/mL (has no administration in time range)    ED Course  I have reviewed the triage vital signs and the nursing notes.  Pertinent labs & imaging results that were available during my care of the patient were reviewed by me and considered in my medical decision making (see chart for details).  Clinical Course as of Jul 24 1339  Thu Jul 23, 2020  1338 Call to CT to evaluate patient for possible IV infiltration.  Tech reports that the patient complained of a lot of pain when the contrast went in.  She did not see the contrast scanning through the chest.  She and the nurse both flushed the IV and a flush without difficulty.  I have examined the site, there is no appearance of infiltrate.  Patient does not have any pain with palpation  around the IV or of the upper arm.  Have also flushed with 10 cc normal saline and flushes well without resistance and no pain to the patient with flushing the IV.  At this time, we have to assume that the contrast infiltrated.  Other IV site is currently infiltrating TPA.  Symptomatically, the patient has improved.  She now is more coordinated and use of her left lower extremity can lift it easily.  Subjectively she feels improvement.  Will try an x-ray on the arm to see if there is any evidence of the infiltrated contrast.  We will need to start another IV to proceed with CT angiogram.  This time, have not DC'd other IV as it clinically shows no signs of being infiltrated.   [MP]    Clinical Course User Index [MP] Charlesetta Shanks, MD   MDM Rules/Calculators/A&P                         Upon initial arrival, patient's history was more suggestive of a global weakness and possibly near syncope with generally more of a tunnel vision effect.  However, on reassessment and physical exam, patient subjectively felt that the left upper and lower extremity felt slightly weaker or less coordinated.  There did seem to be some degree of increased effort and possible incoordination although fairly subtle with the left upper and lower extremity.  At that time I did determine to make patient code stroke for neurology consultation.  The CT head has returned without acute findings.  Will proceed with ongoing work-up.  His blood pressures are normotensive.  EKG is unchanged from previous, patient is in sinus rhythm in the 60s.  Teleneurology has assessed the patient.  They have deemed patient appropriate for TPA.  tPA initiated.  Patient has been stable after bolus.  No acute changes.  Vital signs stable.  Dr. Isla Pence contacted for transfer ED to ED to [facility].  Case has been reviewed with neuro hospitalist who will be consulting on the patient upon arrival to the emergency department at [facility]. Final  Clinical Impression(s) / ED Diagnoses Final diagnoses:  Cerebrovascular accident (  CVA), unspecified mechanism [facility])    Rx / DC Orders ED Discharge Orders    None       Charlesetta Shanks, MD 07/23/20 [place], [place], MD 07/30/20 [phone]

## 2020-07-23 NOTE — ED Triage Notes (Signed)
Pt arrived to [facility] ED as a transfer from [facility] for CVA post tPA. CCT reported onset of s/s was 0900; s/s dizziness and that EMS noted no neuro deficits. CCT reports that around 1200 staff checked on pt and L sided weakness was noted. Code Stroke called, CT negative and tPA given (64.8mg  dose) at 1245. CT angio was attempted per CCT, but 20G L AC infiltrated w/ IV contrast. IV still present upon arrival to ED. CCT reports that NIHSS at 1200 was a 2 and after tPA it was a 0. Pt has hx of MI, HTN, DM and is from home. Pt A&Ox4, VSS, NIHSS 0 upon arrival w/ some weakness noted to L lower extremity. Pt resting comfortably at this time.

## 2020-07-23 NOTE — Plan of Care (Signed)
  Problem: Nutrition: Goal: Adequate nutrition will be maintained Outcome: Progressing   

## 2020-07-23 NOTE — ED Notes (Addendum)
IV attempted x 1 by this nurse and x 2 by RT without success. Pt appears very sleepy. She states she took her normal dose of hydrocodone this morning. Denies pain but c/o tension in her neck

## 2020-07-23 NOTE — ED Provider Notes (Signed)
54 year old female presented Med center [facility] with new onset of weakness.  There she was noted to have some left-sided weakness.  Code stroke was initiated and patient received TPA.  Her symptoms have improved since that time.  On evaluation here patient has no left arm drift.  She has some difficulty initiating raising her left leg up off the bed but is able to hold it to a count of 5.  The remainder of her neuro exam appears normal.  Patient states that she feels improved from previous.  Patient was transferred here to the stroke team for admission. Vitals:   07/23/20 1346 07/23/20 1438  BP: (!) 108/57   Pulse: 72   Resp: 20   Temp:    SpO2: 100% 100%      Pattricia Boss, MD 07/23/20 1447

## 2020-07-24 ENCOUNTER — Inpatient Hospital Stay (HOSPITAL_COMMUNITY): Payer: Managed Care, Other (non HMO)

## 2020-07-24 DIAGNOSIS — I10 Essential (primary) hypertension: Secondary | ICD-10-CM

## 2020-07-24 DIAGNOSIS — G43109 Migraine with aura, not intractable, without status migrainosus: Secondary | ICD-10-CM

## 2020-07-24 DIAGNOSIS — I6389 Other cerebral infarction: Secondary | ICD-10-CM

## 2020-07-24 DIAGNOSIS — I252 Old myocardial infarction: Secondary | ICD-10-CM

## 2020-07-24 DIAGNOSIS — G44209 Tension-type headache, unspecified, not intractable: Secondary | ICD-10-CM

## 2020-07-24 DIAGNOSIS — G459 Transient cerebral ischemic attack, unspecified: Secondary | ICD-10-CM

## 2020-07-24 DIAGNOSIS — F419 Anxiety disorder, unspecified: Secondary | ICD-10-CM | POA: Diagnosis not present

## 2020-07-24 DIAGNOSIS — E785 Hyperlipidemia, unspecified: Secondary | ICD-10-CM

## 2020-07-24 LAB — LIPID PANEL
Cholesterol: 129 mg/dL (ref 0–200)
HDL: 56 mg/dL (ref >40)
LDL Cholesterol: 63 mg/dL (ref 0–99)
Total CHOL/HDL Ratio: 2.3 RATIO
Triglycerides: 52 mg/dL (ref <150)
VLDL: 10 mg/dL (ref 0–40)

## 2020-07-24 LAB — GLUCOSE, CAPILLARY
Glucose-Capillary: 101 mg/dL — ABNORMAL HIGH (ref 70–99)
Glucose-Capillary: 71 mg/dL (ref 70–99)

## 2020-07-24 LAB — ECHOCARDIOGRAM COMPLETE
Area-P 1/2: 5.13 cm2
Height: 67 in
S' Lateral: 3.4 cm
Weight: 2183.44 oz

## 2020-07-24 LAB — HEMOGLOBIN A1C
Hgb A1c MFr Bld: 5.6 % (ref 4.8–5.6)
Mean Plasma Glucose: 114.02 mg/dL

## 2020-07-24 MED ORDER — PANTOPRAZOLE SODIUM 40 MG PO TBEC: 40.0000 mg | DELAYED_RELEASE_TABLET | Freq: Every day | ORAL | Status: DC

## 2020-07-24 MED ORDER — CLOPIDOGREL BISULFATE 75 MG PO TABS: 75.0000 mg | ORAL_TABLET | Freq: Every day | ORAL | 0 refills | Status: DC

## 2020-07-24 MED ORDER — HYDROCODONE-ACETAMINOPHEN 5-325 MG PO TABS
1.0000 | ORAL_TABLET | Freq: Four times a day (QID) | ORAL | Status: DC | PRN
  Administered 2020-07-24: 1 via ORAL
  Filled 2020-07-24: qty 1

## 2020-07-24 MED ORDER — CLOPIDOGREL BISULFATE 75 MG PO TABS
75.0000 mg | ORAL_TABLET | Freq: Every day | ORAL | Status: DC
  Administered 2020-07-24: 75 mg via ORAL
  Filled 2020-07-24: qty 1

## 2020-07-24 MED ORDER — CLOPIDOGREL BISULFATE 75 MG PO TABS: 75.0000 mg | ORAL_TABLET | Freq: Every day | ORAL | 2 refills | Status: DC

## 2020-07-24 MED ORDER — ASPIRIN EC 81 MG PO TBEC
81.0000 mg | DELAYED_RELEASE_TABLET | Freq: Every day | ORAL | Status: DC
  Administered 2020-07-24: 81 mg via ORAL
  Filled 2020-07-24: qty 1

## 2020-07-24 MED ORDER — GADOBUTROL 1 MMOL/ML IV SOLN
6.0000 mL | Freq: Once | INTRAVENOUS | Status: AC | PRN
  Administered 2020-07-24: 6 mL via INTRAVENOUS

## 2020-07-24 MED ORDER — EZETIMIBE 10 MG PO TABS
10.0000 mg | ORAL_TABLET | Freq: Every day | ORAL | Status: DC
  Administered 2020-07-24: 10 mg via ORAL
  Filled 2020-07-24: qty 1

## 2020-07-24 NOTE — Evaluation (Signed)
Physical Therapy Evaluation Patient Details Name: Deanna Roberts MRN: [id number] DOB: [birth year] Today's Date: 07/24/2020   History of Present Illness  54 y.o. female with history of hypertension, diabetes, cancer and CAD.  At 9 AM while at work she suddenly developed left leg incoordination, heaviness in her left arm and dizziness.  It was noted at [facility] that left lower extremity ataxia and drift along with lethargy, per teleneurology.  Patient received tPA and then was transferred to [facility] as CTA was unable to be obtained due to infiltration of contrast.   Clinical Impression  Pt presents to PT at or near her functional baseline. Pt is able to complete all mobility during this session at a modI level at this time with slightly reduced gait speed during dynamic gait tasks. Pt does demonstrate some memory deficits, but also reports these as her baseline for the last few years. Pt is encouraged to ambulate out of the room multiple times a day for the remainder of her hospitalization. Pt has no further PT or DME needs at this time. Acute PT signing off.    Follow Up Recommendations No PT follow up    Equipment Recommendations  None recommended by PT    Recommendations for Other Services       Precautions / Restrictions Precautions Precautions: None Restrictions Weight Bearing Restrictions: No      Mobility  Bed Mobility               General bed mobility comments: not assessed, pt left at edge of bed and received standing in hall with OT  Transfers Overall transfer level: Independent Equipment used: None                Ambulation/Gait Ambulation/Gait assistance: Modified independent (Device/Increase time) Gait Distance (Feet): 400 Feet Assistive device: None Gait Pattern/deviations: WFL(Within Functional Limits) Gait velocity: functional Gait velocity interpretation: >4.37 ft/sec, indicative of normal walking speed General Gait  Details: pt with reduced gait speed for dynamic gait tasks including head turns and backward walking, otherwise able to change gait speeds, stop abruptly and turn quickly when ambulating  Stairs            Wheelchair Mobility    Modified Rankin (Stroke Patients Only) Modified Rankin (Stroke Patients Only) Pre-Morbid Rankin Score: No significant disability Modified Rankin: No significant disability     Balance Overall balance assessment: Independent                                           Pertinent Vitals/Pain Pain Assessment: No/denies pain    Home Living Family/patient expects to be discharged to:: Private residence Living Arrangements: Alone Available Help at Discharge: Family Type of Home: Apartment Home Access: Level entry     Home Layout: One level Home Equipment: None      Prior Function Level of Independence: Independent               Hand Dominance        Extremity/Trunk Assessment   Upper Extremity Assessment Upper Extremity Assessment: Overall WFL for tasks assessed    Lower Extremity Assessment Lower Extremity Assessment: Overall WFL for tasks assessed    Cervical / Trunk Assessment Cervical / Trunk Assessment: Normal  Communication   Communication: No difficulties  Cognition Arousal/Alertness: Awake/alert Behavior During Therapy: WFL for tasks assessed/performed Overall Cognitive Status: Impaired/Different from  baseline Area of Impairment: Memory                     Memory: Decreased short-term memory                General Comments General comments (skin integrity, edema, etc.): VSS on RA    Exercises     Assessment/Plan    PT Assessment Patent does not need any further PT services  PT Problem List         PT Treatment Interventions      PT Goals (Current goals can be found in the Care Plan section)       Frequency     Barriers to discharge        Co-evaluation                AM-PAC PT "6 Clicks" Mobility  Outcome Measure Help needed turning from your back to your side while in a flat bed without using bedrails?: None Help needed moving from lying on your back to sitting on the side of a flat bed without using bedrails?: None Help needed moving to and from a bed to a chair (including a wheelchair)?: None Help needed standing up from a chair using your arms (e.g., wheelchair or bedside chair)?: None Help needed to walk in hospital room?: None Help needed climbing 3-5 steps with a railing? : None 6 Click Score: 24    End of Session   Activity Tolerance: Patient tolerated treatment well Patient left: in bed;with call bell/phone within reach Nurse Communication: Mobility status      Time: 0383-3383 PT Time Calculation (min) (ACUTE ONLY): 10 min   Charges:   PT Evaluation $PT Eval Low Complexity: [place], PT, DPT Acute Rehabilitation Pager: [phone]   Zenaida Niece 07/24/2020, 3:23 PM

## 2020-07-24 NOTE — Progress Notes (Signed)
STROKE TEAM PROGRESS NOTE   SUBJECTIVE (INTERVAL HISTORY) No family is at the bedside.  Overall her condition is completely resolved.  Patient recounted HPI with me.  She stated that yesterday she was at work, had sudden onset neck pain, lower part of the head pain, neck stiffness, and then she felt heaviness of the head, lightheadedness, confusion, altered focus, feeling of passing out.  She also felt short of breath.  She left work, driving home feel nauseous, at her parking lot at home she vomited some bile 3 of vomitus.  She did not feel better she starting to feel left-sided weakness.  EMS called.  She was able to walk towards the stretcher with EMS without problem.  However, in the [place], she was found to have some dysmetria on the left arm and leg.  TPA was given.  And then she was transfer to [facility] for CTA.  However due to IV infiltration, CT head and neck not able to be done, MRI/MRA performed showed no stroke or large vessel occlusion.  She was admitted to ICU for post TPA care.    She stated that right after TPA administration, her all symptoms gone except mild coordination of left arm and leg which also resolved overnight.  Currently she fails that she is 100% back to baseline.   OBJECTIVE Temp:  [98 F (36.7 C)-98.9 F (37.2 C)] 98.9 F (37.2 C) (08/13 0800) Pulse Rate:  [65-83] 69 (08/13 1100) Resp:  [11-24] 11 (08/13 1100) BP: (88-152)/(54-105) 127/75 (08/13 1100) SpO2:  [98 %-100 %] 100 % (08/13 1100) Weight:  [61.9 kg-72 kg] 61.9 kg (08/12 1600)  Recent Labs  Lab 07/23/20 1133 07/23/20 2202 07/24/20 0746 07/24/20 1124  GLUCAP 85 99 101* 71   Recent Labs  Lab 07/23/20 1157  NA 138  K 4.2  CL 102  CO2 26  GLUCOSE 111*  BUN 8  CREATININE 0.67  CALCIUM 9.2   Recent Labs  Lab 07/23/20 1157  AST 21  ALT 12  ALKPHOS 71  BILITOT 0.5  PROT 7.5  ALBUMIN 4.3   Recent Labs  Lab 07/23/20 1157  WBC 3.1*  NEUTROABS 1.9  HGB 13.1  HCT 39.9  MCV  89.9  PLT 190   No results for input(s): CKTOTAL, CKMB, CKMBINDEX, TROPONINI in the last 168 hours. Recent Labs    07/23/20 1157  LABPROT 11.2*  INR 0.8   No results for input(s): COLORURINE, LABSPEC, PHURINE, GLUCOSEU, HGBUR, BILIRUBINUR, KETONESUR, PROTEINUR, UROBILINOGEN, NITRITE, LEUKOCYTESUR in the last 72 hours.  Invalid input(s): APPERANCEUR     Component Value Date/Time   CHOL 129 07/24/2020 0625   TRIG 52 07/24/2020 0625   TRIG 35 09/21/2006 0721   HDL 56 07/24/2020 0625   CHOLHDL 2.3 07/24/2020 0625   VLDL 10 07/24/2020 0625   LDLCALC 63 07/24/2020 0625   No results found for: HGBA1C No results found for: LABOPIA, COCAINSCRNUR, LABBENZ, AMPHETMU, THCU, LABBARB  No results for input(s): ETH in the last 168 hours.  I have personally reviewed the radiological images below and agree with the radiology interpretations.  CT HEAD WO CONTRAST  Result Date: 07/23/2020 CLINICAL DATA:  Dizziness EXAM: CT HEAD WITHOUT CONTRAST TECHNIQUE: Contiguous axial images were obtained from the base of the skull through the vertex without intravenous contrast. COMPARISON:  None. FINDINGS: Brain: No evidence of acute infarction, hemorrhage, hydrocephalus, extra-axial collection or mass lesion/mass effect. Periventricular white matter hypodensity. Vascular: No hyperdense vessel or unexpected calcification. Skull: Normal. Negative  for fracture or focal lesion. Sinuses/Orbits: No acute finding. Other: None. IMPRESSION: No acute intracranial pathology. Small-vessel white matter disease. Electronically Signed   By: Eddie Candle M.D.   On: 07/23/2020 11:51   MR ANGIO HEAD WO CONTRAST  Result Date: 07/23/2020 CLINICAL DATA:  Cerebral hemorrhage suspected.  Stroke, follow-up. EXAM: MRI HEAD WITHOUT CONTRAST MRA HEAD WITHOUT CONTRAST TECHNIQUE: Multiplanar, multiecho pulse sequences of the brain and surrounding structures were obtained without intravenous contrast. Angiographic images of the head were  obtained using MRA technique without contrast. COMPARISON:  Head CT 07/23/2020. FINDINGS: MRI HEAD FINDINGS Brain: Cerebral volume is normal. Mild scattered T2/FLAIR hyperintensity within the cerebral white matter is nonspecific, but consistent with chronic small vessel ischemic disease. Chronic microhemorrhage within the anterior left frontal lobe. There is no acute infarct. No evidence of intracranial mass. No extra-axial fluid collection. No midline shift. Expected proximal arterial flow voids. Vascular: Expected proximal arterial flow voids. Skull and upper cervical spine: No focal marrow lesion. Sinuses/Orbits: Visualized orbits show no acute finding. Mild ethmoid sinus mucosal thickening. No significant mastoid effusion. 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 MRA HEAD FINDINGS The intracranial internal carotid arteries are patent. The M1 middle cerebral arteries are patent without significant stenosis. No M2 proximal branch occlusion is identified. There is atherosclerotic irregularity of an inferior division right M2 MCA branch vessel. This includes sites of apparent moderate/severe stenosis within the proximal to mid vessel (series 252, image 11). The anterior cerebral arteries are patent. The intracranial vertebral arteries are patent. The basilar artery is patent. The posterior cerebral arteries are patent without significant proximal stenosis No intracranial aneurysm is identified. IMPRESSION: MRI brain: 1. No evidence of acute intracranial abnormality, including acute infarction. 2. Mild chronic small vessel ischemic changes within the cerebral white matter. 3. 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 4. Mild ethmoid sinus mucosal thickening. MRA head: 1. No intracranial large vessel occlusion. 2. Atherosclerotic irregularity of an inferior  division proximal to mid M2 right MCA branch vessel with sites of apparent moderate/severe stenosis. However, these apparent stenoses could be accentuated on the MIP images due to small vessel size. Electronically Signed   By: Kellie Simmering DO   On: 07/23/2020 16:14   MR BRAIN WO CONTRAST  Result Date: 07/23/2020 CLINICAL DATA:  Cerebral hemorrhage suspected.  Stroke, follow-up. EXAM: MRI HEAD WITHOUT CONTRAST MRA HEAD WITHOUT CONTRAST TECHNIQUE: Multiplanar, multiecho pulse sequences of the brain and surrounding structures were obtained without intravenous contrast. Angiographic images of the head were obtained using MRA technique without contrast. COMPARISON:  Head CT 07/23/2020. FINDINGS: MRI HEAD FINDINGS Brain: Cerebral volume is normal. Mild scattered T2/FLAIR hyperintensity within the cerebral white matter is nonspecific, but consistent with chronic small vessel ischemic disease. Chronic microhemorrhage within the anterior left frontal lobe. There is no acute infarct. No evidence of intracranial mass. No extra-axial fluid collection. No midline shift. Expected proximal arterial flow voids. Vascular: Expected proximal arterial flow voids. Skull and upper cervical spine: No focal marrow lesion. Sinuses/Orbits: Visualized orbits show no acute finding. Mild ethmoid sinus mucosal thickening. No significant mastoid effusion. 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 MRA HEAD FINDINGS The intracranial internal carotid arteries are patent. The M1 middle cerebral arteries are patent without significant stenosis. No M2 proximal branch occlusion is  identified. There is atherosclerotic irregularity of an inferior division right M2 MCA branch vessel. This includes sites of apparent moderate/severe stenosis within the proximal to mid vessel (series 252, image 11). The anterior cerebral  arteries are patent. The intracranial vertebral arteries are patent. The basilar artery is patent. The posterior cerebral arteries are patent without significant proximal stenosis No intracranial aneurysm is identified. IMPRESSION: MRI brain: 1. No evidence of acute intracranial abnormality, including acute infarction. 2. Mild chronic small vessel ischemic changes within the cerebral white matter. 3. 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 4. Mild ethmoid sinus mucosal thickening. MRA head: 1. No intracranial large vessel occlusion. 2. Atherosclerotic irregularity of an inferior division proximal to mid M2 right MCA branch vessel with sites of apparent moderate/severe stenosis. However, these apparent stenoses could be accentuated on the MIP images due to small vessel size. Electronically Signed   By: Kellie Simmering DO   On: 07/23/2020 16:14   MM DIAG BREAST TOMO UNI LEFT  Result Date: 07/03/2020 CLINICAL DATA:  LEFT lumpectomy in 2019. Single calcification noted in the LEFT lumpectomy site on diagnostic evaluation performed in January 2021. Short interval follow-up. EXAM: DIGITAL DIAGNOSTIC UNILATERAL LEFT MAMMOGRAM WITH TOMO AND CAD COMPARISON:  12/27/2019 and earlier ACR Breast Density Category c: The breast tissue is heterogeneously dense, which may obscure small masses. FINDINGS: Postoperative changes are identified in the UPPER-OUTER QUADRANT of the LEFT breast. Single calcification identified on the prior study is no longer apparent. No new or suspicious findings. Mammographic images were processed with CAD. IMPRESSION: Expected postoperative changes in the LEFT breast. No mammographic evidence for malignancy. RECOMMENDATION: Recommend bilateral diagnostic mammogram in January 2022. I have discussed the findings and recommendations with the patient. If applicable, a reminder letter will be sent to the patient regarding the next appointment. BI-RADS CATEGORY  2: Benign.  Electronically Signed   By: Nolon Nations M.D.   On: 07/03/2020 15:23   ECHOCARDIOGRAM COMPLETE  Result Date: 07/24/2020    ECHOCARDIOGRAM REPORT   Patient Name:   Deanna Roberts Date of Exam: 07/24/2020 Medical Rec #:  [id number]     Height:       67.0 in Accession #:    [id number]    Weight:       136.5 lb Date of Birth:  [birth year]     BSA:          1.719 m Patient Age:    89 years      BP:           133/71 mmHg Patient Gender: F             HR:           80 bpm. Exam Location:  Inpatient Procedure: 2D Echo, Cardiac Doppler and Color Doppler Indications:    CVA  History:        Patient has no prior history of Echocardiogram examinations. CAD                 and Previous Myocardial Infarction; Risk Factors:Hypertension                 and Diabetes.  Sonographer:    Dustin Flock Referring Phys: [place]  1. Left ventricular ejection fraction, by estimation, is 55 to 60%. The left ventricle has normal function. The left ventricle demonstrates regional wall motion abnormalities (see scoring diagram/findings for description). Left ventricular diastolic parameters were normal. There is mild hypokinesis of the  left ventricular, basal inferior wall.  2. Right ventricular systolic function is normal. The right ventricular size is normal. There is normal pulmonary artery systolic pressure. The estimated right ventricular systolic pressure is 61.9 mmHg.  3. The mitral valve is normal in structure. Trivial mitral valve regurgitation. No evidence of mitral stenosis.  4. The aortic valve is normal in structure. Aortic valve regurgitation is not visualized. No aortic stenosis is present.  5. The inferior vena cava is dilated in size with <50% respiratory variability, suggesting right atrial pressure of 15 mmHg. FINDINGS  Left Ventricle: Left ventricular ejection fraction, by estimation, is 55 to 60%. The left ventricle has normal function. The left ventricle demonstrates regional wall motion  abnormalities. Mild hypokinesis of the left ventricular, basal inferior wall. The left ventricular internal cavity size was normal in size. There is no left ventricular hypertrophy. Left ventricular diastolic parameters were normal. Normal left ventricular filling pressure. Right Ventricle: The right ventricular size is normal. No increase in right ventricular wall thickness. Right ventricular systolic function is normal. There is normal pulmonary artery systolic pressure. The tricuspid regurgitant velocity is 2.22 m/s, and  with an assumed right atrial pressure of 15 mmHg, the estimated right ventricular systolic pressure is 50.9 mmHg. Left Atrium: Left atrial size was normal in size. Right Atrium: Right atrial size was normal in size. Pericardium: There is no evidence of pericardial effusion. Mitral Valve: The mitral valve is normal in structure. Normal mobility of the mitral valve leaflets. Trivial mitral valve regurgitation. No evidence of mitral valve stenosis. Tricuspid Valve: The tricuspid valve is normal in structure. Tricuspid valve regurgitation is trivial. No evidence of tricuspid stenosis. Aortic Valve: The aortic valve is normal in structure. Aortic valve regurgitation is not visualized. No aortic stenosis is present. Pulmonic Valve: The pulmonic valve was normal in structure. Pulmonic valve regurgitation is not visualized. No evidence of pulmonic stenosis. Aorta: The aortic root is normal in size and structure. Venous: The inferior vena cava is dilated in size with less than 50% respiratory variability, suggesting right atrial pressure of 15 mmHg. IAS/Shunts: No atrial level shunt detected by color flow Doppler.  LEFT VENTRICLE PLAX 2D LVIDd:         4.80 cm  Diastology LVIDs:         3.40 cm  LV e' lateral:   10.20 cm/s LV PW:         1.00 cm  LV E/e' lateral: 5.8 LV IVS:        0.90 cm  LV e' medial:    8.05 cm/s LVOT diam:     2.20 cm  LV E/e' medial:  7.3 LV SV:         70 LV SV Index:   41 LVOT  Area:     3.80 cm  RIGHT VENTRICLE RV Basal diam:  2.30 cm RV S prime:     10.10 cm/s TAPSE (M-mode): 2.5 cm LEFT ATRIUM             Index       RIGHT ATRIUM           Index LA diam:        3.20 cm 1.86 cm/m  RA Area:     13.30 cm LA Vol (A2C):   42.4 ml 24.67 ml/m RA Volume:   34.70 ml  20.19 ml/m LA Vol (A4C):   36.1 ml 21.00 ml/m LA Biplane Vol: 39.9 ml 23.21 ml/m  AORTIC VALVE LVOT Vmax:  101.00 cm/s LVOT Vmean:  71.600 cm/s LVOT VTI:    0.185 m  AORTA Ao Root diam: 2.90 cm MITRAL VALVE               TRICUSPID VALVE MV Area (PHT): 5.13 cm    TR Peak grad:   19.7 mmHg MV Decel Time: 148 msec    TR Vmax:        222.00 cm/s MV E velocity: 59.10 cm/s MV A velocity: 64.70 cm/s  SHUNTS MV E/A ratio:  0.91        Systemic VTI:  0.18 m                            Systemic Diam: 2.20 cm Mihai Croitoru MD Electronically signed by Sanda Klein MD Signature Date/Time: 07/24/2020/10:49:26 AM    Final     PHYSICAL EXAM  Temp:  [98 F (36.7 C)-98.9 F (37.2 C)] 98.9 F (37.2 C) (08/13 0800) Pulse Rate:  [65-83] 69 (08/13 1100) Resp:  [11-24] 11 (08/13 1100) BP: (88-152)/(54-105) 127/75 (08/13 1100) SpO2:  [98 %-100 %] 100 % (08/13 1100) Weight:  [61.9 kg-72 kg] 61.9 kg (08/12 1600)  General - Well nourished, well developed, in no apparent distress.  Ophthalmologic - fundi not visualized due to noncooperation.  Cardiovascular - Regular rhythm and rate.  Mental Status -  Level of arousal and orientation to time, place, and person were intact. Language including expression, naming, repetition, comprehension was assessed and found intact. Attention span and concentration were normal. Recent and remote memory were intact. Fund of Knowledge was assessed and was intact.  Cranial Nerves II - XII - II - Visual field intact OU. III, IV, VI - Extraocular movements intact. V - Facial sensation intact bilaterally. VII - Facial movement intact bilaterally. VIII - Hearing & vestibular intact  bilaterally. X - Palate elevates symmetrically. XI - Chin turning & shoulder shrug intact bilaterally. XII - Tongue protrusion intact.  Motor Strength - The patient's strength was normal in all extremities and pronator drift was absent.  Bulk was normal and fasciculations were absent.   Motor Tone - Muscle tone was assessed at the neck and appendages and was normal.  Reflexes - The patient's reflexes were symmetrical in all extremities and she had no pathological reflexes.  Sensory - Light touch, temperature/pinprick were assessed and were symmetrical.    Coordination - The patient had normal movements in the right hand and feet with no ataxia or dysmetria.  Slight hesitation of left hand finger-to-nose more  functional component. Temor was absent.  Gait and Station - deferred.   ASSESSMENT/PLAN Deanna Roberts is a 54 y.o. female with history of HTN, DM, CAD/MI in 2013, breast cancer admitted for episode of headache, neck pain, lightheadedness, confusion, nausea/vomiting, left-sided heaviness and dysmetria. tPA given by telemetry neurology.    TIA vs complicated migraine vs anxiety - s/p TPA  Presented with headache, neck pain, lightheadedness, confusion, nausea/vomiting, shortness of breath, left-sided heaviness - more nonspecific when look back  CT no acute abnormality  MRI no acute infarct  MRA right M2 likely atherosclerosis  MRI repeat pending  MRA neck pending  2D Echo EF 55 to 60%  LDL 63  HgbA1c pending  SCDs for VTE prophylaxis  aspirin 81 mg daily prior to admission, now on No antithrombotic within 24 hours of TPA  Patient counseled to be compliant with her antithrombotic medications  Ongoing aggressive stroke risk factor  management  Therapy recommendations: Pending  Disposition: Pending  Hypertension . Stable . Home meds including HCTZ and lisinopril . BP goal less than 180/105 post TPA  Long term BP goal normotensive  Hyperlipidemia  Home  meds: Zetia  LDL 63, goal < 70  Now on Zetia  Continue Zetia at discharge  Other Stroke Risk Factors  Coronary artery disease/MI in 2013, on aspirin PTA  Other Active Problems  Headache and neck pain on presentation - ?  Tension headache?  Occipital neuralgia  Hospital day # 1  This patient is critically ill due to strokelike symptoms status post TPA needing close ICU monitoring and at significant risk of neurological worsening, death form hemorrhage, stroke, seizure. This patient's care requires constant monitoring of vital signs, hemodynamics, respiratory and cardiac monitoring, review of multiple databases, neurological assessment, discussion with family, other specialists and medical decision making of high complexity. I spent 30 minutes of neurocritical care time in the care of this patient.   Rosalin Hawking, MD PhD Stroke Neurology 07/24/2020 11:55 AM    To contact Stroke Continuity provider, please refer to [email or web address] After hours, contact General Neurology

## 2020-07-24 NOTE — Research (Signed)
OPTIMISTmain Trial Enrollment  Patient meets criteria for OPTIMISTmain trial. Patient Information Sheet reviewed and given to patient. All questions answered and patient given opportunity to opt out, but decided to proceed with trial. Patient made aware of discharge questions, data collection and analyzation, and 90 day phone call regarding study. Verbalized understanding.   Pt to be discharged home today. Form D "Patient Reported Experience Measures" completed and recorded with patient.   Mohammed Kindle RN BSN SCRN  Stroke Response Coordinator

## 2020-07-24 NOTE — Progress Notes (Signed)
*  PRELIMINARY RESULTS* Echocardiogram 2D Echocardiogram has been performed.  Deanna Roberts 07/24/2020, 9:48 AM

## 2020-07-24 NOTE — Progress Notes (Signed)
OT Cancellation Note  Patient Details Name: Deanna Roberts MRN: [id number] DOB: 07/20/1966   Cancelled Treatment:    Reason Eval/Treat Not Completed: Patient at procedure or test/ unavailable.  Pt in MRI  [name], OTR/L Acute Rehabilitation Services Pager [phone] Office [phone]   [name] 07/24/2020, 12:52 PM

## 2020-07-24 NOTE — Discharge Instructions (Signed)
Continue ASA 81 and zetia in your home medication Prescribed plavix 75mg  daily for your for 3 weeks only.  Follow up with PCP Will follow up with neurology in 4 weeks. You will be called for the appointment Have set up outpatient speech therapy for you. You will be called for the appointment.

## 2020-07-24 NOTE — Evaluation (Signed)
Speech Language Pathology Evaluation Patient Details Name: Deanna Roberts MRN: [id number] DOB: 18-May-1966 Today's Date: 07/24/2020 Time: 0973-5329 SLP Time Calculation (min) (ACUTE ONLY): 21 min  Problem List:  Patient Active Problem List   Diagnosis Date Noted  . Stroke (cerebrum) ([place]) 07/23/2020  . Carcinoma of upper-outer quadrant of left breast in female, estrogen receptor positive ([place]) 01/30/2018  . 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 FEVER UNSPECIFIED 03/17/2009  . ANEMIA-IRON DEFICIENCY 10/08/2007  . SICKLE CELL TRAIT 10/08/2007  . DISEASE, SICKLE CELL W/CRISIS NEC 10/08/2007  . HYPERTENSION 10/08/2007  . GLUCOSE INTOLERANCE, HX OF 10/08/2007   Past Medical History:  Past Medical History:  Diagnosis Date  . Cancer ([place])   . Coronary artery disease   . Diabetes mellitus without complication ([place])   . History of radiation therapy 05/30/18- 07/04/18   Left Breast 40.05 Gy in 15 fractions, Left Breast boost 5 Gy in 5 fractions.   . Hypertension   . Myocardial infarction [facility]) 2013   Past Surgical History:  Past Surgical History:  Procedure Laterality Date  . ABDOMINAL HYSTERECTOMY    . BREAST LUMPECTOMY Left 2019  . BREAST LUMPECTOMY WITH RADIOACTIVE SEED LOCALIZATION Left 03/22/2018   Procedure: LEFT BREAST LUMPECTOMY WITH BRACKETED RADIOACTIVE SEED LOCALIZATION;  Surgeon: Erroll Luna, MD;  Location: [facility];  Service: General;  Laterality: Left;   HPI:  54 y.o. female with history of hypertension, diabetes, cancer and CAD.  At 9 AM while at work she suddenly developed left leg incoordination, heaviness in her left arm and dizziness.  It was noted at [facility] that left lower extremity ataxia and drift along with lethargy, per teleneurology.  Patient received tPA and then was transferred to [facility] MRI negative.    Assessment / Plan / Recommendation Clinical Impression   Pt scored a 21/30 on the  SLUMS, suggestive of mild cognitive impairment. Pt has good awareness of her deficits, most significantly observed with delayed recall and selective attention, and even self-corrected a few errors during testing. Although these do not appear to be acute changes related to this admission, she says that they are new since 2018. She describes several strategies that she has used as compensations, which have helped her functionally, but there are still times when she makes significant errors (such as forgetting to pay her mortgage). SLP reviewed additional strategies to enhance her memory strategies with emphasis on using her phone as a memory aid. She would also benefit from OP SLP services to maximize safety and functional independence. All further needs can be addressed at next level of care.      SLP Assessment  SLP Recommendation/Assessment: All further Speech Lanaguage Pathology  needs can be addressed in the next venue of care SLP Visit Diagnosis: Cognitive communication deficit (R41.841)    Follow Up Recommendations  Outpatient SLP    Frequency and Duration           SLP Evaluation Cognition  Overall Cognitive Status: Impaired/Different from baseline Arousal/Alertness: Awake/alert Orientation Level: Oriented X4 Attention: Selective Selective Attention: Impaired Selective Attention Impairment: Verbal complex Memory: Impaired Memory Impairment: Decreased recall of new information;Retrieval deficit Awareness: Appears intact Problem Solving: Impaired Problem Solving Impairment: Verbal complex Safety/Judgment: Impaired       Comprehension  Auditory Comprehension Overall Auditory Comprehension: Appears within functional limits for tasks assessed    Expression Expression Primary Mode of Expression: Verbal Verbal Expression Overall Verbal Expression: Appears within functional limits for  tasks assessed Written Expression Dominant Hand: Right   Oral / Motor      GO                     Osie Bond., M.A. [place] Acute Rehabilitation Services Pager [phone] Office [phone]  07/24/2020, 3:50 PM

## 2020-07-24 NOTE — Evaluation (Signed)
Occupational Therapy Evaluation Patient Details Name: Deanna Roberts MRN: [id number] DOB: 14-Jul-1966 Today's Date: 07/24/2020    History of Present Illness 54 y.o. female with history of hypertension, diabetes, cancer and CAD.  At 9 AM while at work she suddenly developed left leg incoordination, heaviness in her left arm and dizziness.  It was noted at [facility] that left lower extremity ataxia and drift along with lethargy, per teleneurology.  Patient received tPA and then was transferred to [facility] as CTA was unable to be obtained due to infiltration of contrast. MRI of brain negative for acute infarct.    Clinical Impression   Pt admitted with the above.  She is able to perform ADLs independently, and appears to be at her baseline, however, cognitive deficit noted during administration of the Short Blessed Test - she scored 11/28.  Discussed results with her and she acknowledges baseline deficits in which she has been instituting compensatory strategies on her own.  Pt lives alone and works full time.  Discussed with SLP, and recommend OP SLP at discharge. OT will sign off.     Follow Up Recommendations  No OT follow up (recommend OP SLP )    Equipment Recommendations  None recommended by OT    Recommendations for Other Services       Precautions / Restrictions Precautions Precautions: None Restrictions Weight Bearing Restrictions: No      Mobility Bed Mobility Overal bed mobility: Independent             General bed mobility comments: not assessed, pt left at edge of bed and received standing in hall with OT  Transfers Overall transfer level: Independent Equipment used: None                  Balance Overall balance assessment: Independent                                         ADL either performed or assessed with clinical judgement   ADL Overall ADL's : Independent                                              Vision Baseline Vision/History: Wears glasses Wears Glasses: At all times Patient Visual Report: No change from baseline Vision Assessment?: Yes Eye Alignment: Within Functional Limits Ocular Range of Motion: Within Functional Limits Alignment/Gaze Preference: Within Defined Limits Tracking/Visual Pursuits: Able to track stimulus in all quads without difficulty Visual Fields: No apparent deficits Additional Comments: able to read and scan menu independently      Perception Perception Perception Tested?: Yes   Praxis Praxis Praxis tested?: Within functional limits    Pertinent Vitals/Pain Pain Assessment: No/denies pain     Hand Dominance Right   Extremity/Trunk Assessment Upper Extremity Assessment Upper Extremity Assessment: Overall WFL for tasks assessed   Lower Extremity Assessment Lower Extremity Assessment: Overall WFL for tasks assessed   Cervical / Trunk Assessment Cervical / Trunk Assessment: Normal   Communication Communication Communication: No difficulties   Cognition Arousal/Alertness: Awake/alert Behavior During Therapy: WFL for tasks assessed/performed Overall Cognitive Status: Impaired/Different from baseline Area of Impairment: Memory;Attention                     Memory:  Decreased short-term memory         General Comments: Pt was able to place complex lunch order independently.  The Short Blessed Test was Administered with pt scoring 11/28 which is indicative of cognitive deficit.  She demonstrated deficits with attention and sequencing as well as time orientation, and difficulty with recall. She does endorse memory deficits which she reports has been present since 2018 since the loss of her twin and her mother.  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: None Help from another person bathing (including washing, rinsing, drying)?: None Help from another person to put on and taking off regular upper body clothing?: None Help from another person to put on and taking off regular lower body clothing?: None 6 Click Score: 24   End of Session Nurse Communication: Mobility status  Activity Tolerance: Patient tolerated treatment well Patient left: Other (comment) (with PT )  OT Visit Diagnosis: Cognitive communication deficit (R41.841) Symptoms and signs involving cognitive functions: Cerebral infarction                Time: 5643-3295 OT Time Calculation (min): 25 min Charges:  OT General Charges $OT Visit: 1 Visit OT Evaluation $OT Eval Low Complexity: 1 Low OT Treatments $Therapeutic Activity: 8-22 mins  [name], OTR/L Acute Rehabilitation Services Pager [phone] Office [phone]   Deanna Roberts  M 07/24/2020, 3:47 PM

## 2020-07-24 NOTE — Discharge Summary (Signed)
Physician Discharge Summary  Patient ID: Avaley Coop MRN: [id number] DOB/AGE: [birth year] 54 y.o.  Admit date: 07/23/2020 Discharge date: 07/24/2020  Admission Diagnoses:  Discharge Diagnoses:  Active Problems:   TIA vs complicated migraine vs anxiety - s/p TPA  Other diagnosis  HTN  HLD  CAD/MI  DM  Discharged Condition: good  Hospital Course:  Ms. Daleiza Bacchi is a 54 y.o. female with history of HTN, DM, CAD/MI in 2013, breast cancer admitted for episode of headache, neck pain, lightheadedness, confusion, nausea/vomiting, left-sided heaviness and dysmetria. tPA given by telemetry neurology.    TIA vs complicated migraine vs anxiety - s/p TPA  Presented with headache, neck pain, lightheadedness, confusion, nausea/vomiting, shortness of breath, left-sided heaviness - more nonspecific when look back  CT no acute abnormality  MRI no acute infarct  MRA right M2 likely atherosclerosis  MRI repeat no acute infarct  MRA neck no LVO, specifically no VA dissection. 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  2D Echo EF 55 to 60%  LDL 63  HgbA1c 5.6  SCDs for VTE prophylaxis  aspirin 81 mg daily prior to admission, now on ASA and Plavix DAPT for 3 weeks and then ASA alone.  Patient counseled to be compliant with her antithrombotic medications  Ongoing aggressive stroke risk factor management  Therapy recommendations: OP SLP  Disposition: home  Hypertension  Stable  Home meds including HCTZ and lisinopril  BP goal less than 180/105 post TPA  Resume home meds on discharge  Long term BP goal normotensive  Hyperlipidemia  Home meds: Zetia  LDL 63, goal < 70  Now on Zetia  Continue Zetia at discharge  Other Stroke Risk Factors  Coronary artery disease/MI in 2013, on aspirin PTA  Other Active Problems  Headache and neck pain on presentation - ?  Tension headache?  Occipital  neuralgia   Consults: None  Significant Diagnostic Studies:  CT HEAD WO CONTRAST  Result Date: 07/23/2020 CLINICAL DATA:  Dizziness EXAM: CT HEAD WITHOUT CONTRAST TECHNIQUE: Contiguous axial images were obtained from the base of the skull through the vertex without intravenous contrast. COMPARISON:  None. FINDINGS: Brain: No evidence of acute infarction, hemorrhage, hydrocephalus, extra-axial collection or mass lesion/mass effect. Periventricular white matter hypodensity. Vascular: No hyperdense vessel or unexpected calcification. Skull: Normal. Negative for fracture or focal lesion. Sinuses/Orbits: No acute finding. Other: None. IMPRESSION: No acute intracranial pathology. Small-vessel white matter disease. Electronically Signed   By: Eddie Candle M.D.   On: 07/23/2020 11:51   MR ANGIO HEAD WO CONTRAST  Result Date: 07/23/2020 CLINICAL DATA:  Cerebral hemorrhage suspected.  Stroke, follow-up. EXAM: MRI HEAD WITHOUT CONTRAST MRA HEAD WITHOUT CONTRAST TECHNIQUE: Multiplanar, multiecho pulse sequences of the brain and surrounding structures were obtained without intravenous contrast. Angiographic images of the head were obtained using MRA technique without contrast. COMPARISON:  Head CT 07/23/2020. FINDINGS: MRI HEAD FINDINGS Brain: Cerebral volume is normal. Mild scattered T2/FLAIR hyperintensity within the cerebral white matter is nonspecific, but consistent with chronic small vessel ischemic disease. Chronic microhemorrhage within the anterior left frontal lobe. There is no acute infarct. No evidence of intracranial mass. No extra-axial fluid collection. No midline shift. Expected proximal arterial flow voids. Vascular: Expected proximal arterial flow voids. Skull and upper cervical spine: No focal marrow lesion. Sinuses/Orbits: Visualized orbits show no acute finding. Mild ethmoid sinus mucosal thickening. No significant mastoid effusion. 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 MRA HEAD FINDINGS The intracranial internal carotid arteries are patent. The M1 middle cerebral arteries are patent without significant stenosis. No M2 proximal branch occlusion is identified. There is atherosclerotic irregularity of an inferior division right M2 MCA branch vessel. This includes sites of apparent moderate/severe stenosis within the proximal to mid vessel (series 252, image 11). The anterior cerebral arteries are patent. The intracranial vertebral arteries are patent. The basilar artery is patent. The posterior cerebral arteries are patent without significant proximal stenosis No intracranial aneurysm is identified. IMPRESSION: MRI brain: 1. No evidence of acute intracranial abnormality, including acute infarction. 2. Mild chronic small vessel ischemic changes within the cerebral white matter. 3. 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 4. Mild ethmoid sinus mucosal thickening. MRA head: 1. No intracranial large vessel occlusion. 2. Atherosclerotic irregularity of an inferior division proximal to mid M2 right MCA branch vessel with sites of apparent moderate/severe stenosis. However, these apparent stenoses could be accentuated on the MIP images due to small vessel size. Electronically Signed   By: Kellie Simmering DO   On: 07/23/2020 16:14   MR ANGIO NECK W WO CONTRAST  Result Date: 07/24/2020 CLINICAL DATA:  Vertebral artery dissection suspected. EXAM: MRA NECK WITHOUT AND WITH CONTRAST TECHNIQUE: Multiplanar and multiecho pulse sequences of the neck were obtained without and with intravenous contrast. Angiographic images of the neck were obtained using MRA technique without and with intravenous contrast. CONTRAST:  68mL GADAVIST GADOBUTROL 1 MMOL/ML IV SOLN COMPARISON:  Brain MRI 07/24/2020, brain MRI 07/23/2020 FINDINGS: Standard aortic branching. The  visualized aortic arch is unremarkable. No hemodynamically significant innominate or proximal subclavian artery stenosis. The bilateral common and internal carotid arteries are patent within the neck without hemodynamically significant stenosis. There is multifocal irregularity of the distal cervical right ICA and mid cervical left ICA, with an appearance suggestive of fibromuscular dysplasia. The vertebral arteries are codominant and patent within the neck without stenosis or significant luminal irregularity. IMPRESSION: The bilateral common and internal carotid arteries are patent within the neck without hemodynamically significant stenosis. There is irregularity of the distal cervical right ICA and mid cervical left ICA with an appearance suggestive of fibromuscular dysplasia. There is no definite MRA evidence of superimposed dissection at these sites. However, given the reported symptoms, consider CTA of the neck for further evaluation. The vertebral arteries are codominant and patent within the neck without stenosis or significant luminal irregularity. Electronically Signed   By: Kellie Simmering DO   On: 07/24/2020 13:50   MR BRAIN WO CONTRAST  Result Date: 07/24/2020 CLINICAL DATA:  Follow-up stroke presentation EXAM: MRI HEAD WITHOUT CONTRAST TECHNIQUE: Multiplanar, multiecho pulse sequences of the brain and surrounding structures were obtained without intravenous contrast. COMPARISON:  MRI and CT studies done yesterday. FINDINGS: Brain: Diffusion imaging does not show any acute or subacute infarction. Mild chronic small-vessel ischemic changes affect the hemispheric white matter. No sign of cortical or large vessel territory stroke. No mass, hemorrhage, hydrocephalus or extra-axial collection. Vascular: Major vessels at the base of the brain show flow. Skull and upper cervical spine: Negative Sinuses/Orbits: Clear/normal Other: None IMPRESSION: No change. No acute finding by MRI. Mild chronic small-vessel  ischemic changes of the hemispheric white matter. Electronically Signed   By: Nelson Chimes M.D.   On: 07/24/2020 12:40   MR BRAIN WO CONTRAST  Result Date: 07/23/2020 CLINICAL DATA:  Cerebral hemorrhage suspected.  Stroke,  follow-up. EXAM: MRI HEAD WITHOUT CONTRAST MRA HEAD WITHOUT CONTRAST TECHNIQUE: Multiplanar, multiecho pulse sequences of the brain and surrounding structures were obtained without intravenous contrast. Angiographic images of the head were obtained using MRA technique without contrast. COMPARISON:  Head CT 07/23/2020. FINDINGS: MRI HEAD FINDINGS Brain: Cerebral volume is normal. Mild scattered T2/FLAIR hyperintensity within the cerebral white matter is nonspecific, but consistent with chronic small vessel ischemic disease. Chronic microhemorrhage within the anterior left frontal lobe. There is no acute infarct. No evidence of intracranial mass. No extra-axial fluid collection. No midline shift. Expected proximal arterial flow voids. Vascular: Expected proximal arterial flow voids. Skull and upper cervical spine: No focal marrow lesion. Sinuses/Orbits: Visualized orbits show no acute finding. Mild ethmoid sinus mucosal thickening. No significant mastoid effusion. 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 MRA HEAD FINDINGS The intracranial internal carotid arteries are patent. The M1 middle cerebral arteries are patent without significant stenosis. No M2 proximal branch occlusion is identified. There is atherosclerotic irregularity of an inferior division right M2 MCA branch vessel. This includes sites of apparent moderate/severe stenosis within the proximal to mid vessel (series 252, image 11). The anterior cerebral arteries are patent. The intracranial vertebral arteries are patent. The basilar artery is patent. The posterior cerebral arteries are patent without  significant proximal stenosis No intracranial aneurysm is identified. IMPRESSION: MRI brain: 1. No evidence of acute intracranial abnormality, including acute infarction. 2. Mild chronic small vessel ischemic changes within the cerebral white matter. 3. 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 4. Mild ethmoid sinus mucosal thickening. MRA head: 1. No intracranial large vessel occlusion. 2. Atherosclerotic irregularity of an inferior division proximal to mid M2 right MCA branch vessel with sites of apparent moderate/severe stenosis. However, these apparent stenoses could be accentuated on the MIP images due to small vessel size. Electronically Signed   By: Kellie Simmering DO   On: 07/23/2020 16:14   MM DIAG BREAST TOMO UNI LEFT  Result Date: 07/03/2020 CLINICAL DATA:  LEFT lumpectomy in 2019. Single calcification noted in the LEFT lumpectomy site on diagnostic evaluation performed in January 2021. Short interval follow-up. EXAM: DIGITAL DIAGNOSTIC UNILATERAL LEFT MAMMOGRAM WITH TOMO AND CAD COMPARISON:  12/27/2019 and earlier ACR Breast Density Category c: The breast tissue is heterogeneously dense, which may obscure small masses. FINDINGS: Postoperative changes are identified in the UPPER-OUTER QUADRANT of the LEFT breast. Single calcification identified on the prior study is no longer apparent. No new or suspicious findings. Mammographic images were processed with CAD. IMPRESSION: Expected postoperative changes in the LEFT breast. No mammographic evidence for malignancy. RECOMMENDATION: Recommend bilateral diagnostic mammogram in January 2022. I have discussed the findings and recommendations with the patient. If applicable, a reminder letter will be sent to the patient regarding the next appointment. BI-RADS CATEGORY  2: Benign. Electronically Signed   By: Nolon Nations M.D.   On: 07/03/2020 15:23   ECHOCARDIOGRAM COMPLETE  Result Date: 07/24/2020    ECHOCARDIOGRAM REPORT    Patient Name:   ENIYAH EASTMOND Date of Exam: 07/24/2020 Medical Rec #:  [id number]     Height:       67.0 in Accession #:    [id number]    Weight:       136.5 lb Date of Birth:  1965-12-17     BSA:          1.719 m Patient Age:  54 years      BP:           133/71 mmHg Patient Gender: F             HR:           80 bpm. Exam Location:  Inpatient Procedure: 2D Echo, Cardiac Doppler and Color Doppler Indications:    CVA  History:        Patient has no prior history of Echocardiogram examinations. CAD                 and Previous Myocardial Infarction; Risk Factors:Hypertension                 and Diabetes.  Sonographer:    Dustin Flock Referring Phys: [place]  1. Left ventricular ejection fraction, by estimation, is 55 to 60%. The left ventricle has normal function. The left ventricle demonstrates regional wall motion abnormalities (see scoring diagram/findings for description). Left ventricular diastolic parameters were normal. There is mild hypokinesis of the left ventricular, basal inferior wall.  2. Right ventricular systolic function is normal. The right ventricular size is normal. There is normal pulmonary artery systolic pressure. The estimated right ventricular systolic pressure is 06.3 mmHg.  3. The mitral valve is normal in structure. Trivial mitral valve regurgitation. No evidence of mitral stenosis.  4. The aortic valve is normal in structure. Aortic valve regurgitation is not visualized. No aortic stenosis is present.  5. The inferior vena cava is dilated in size with <50% respiratory variability, suggesting right atrial pressure of 15 mmHg. FINDINGS  Left Ventricle: Left ventricular ejection fraction, by estimation, is 55 to 60%. The left ventricle has normal function. The left ventricle demonstrates regional wall motion abnormalities. Mild hypokinesis of the left ventricular, basal inferior wall. The left ventricular internal cavity size was normal in size. There is no left  ventricular hypertrophy. Left ventricular diastolic parameters were normal. Normal left ventricular filling pressure. Right Ventricle: The right ventricular size is normal. No increase in right ventricular wall thickness. Right ventricular systolic function is normal. There is normal pulmonary artery systolic pressure. The tricuspid regurgitant velocity is 2.22 m/s, and  with an assumed right atrial pressure of 15 mmHg, the estimated right ventricular systolic pressure is 01.6 mmHg. Left Atrium: Left atrial size was normal in size. Right Atrium: Right atrial size was normal in size. Pericardium: There is no evidence of pericardial effusion. Mitral Valve: The mitral valve is normal in structure. Normal mobility of the mitral valve leaflets. Trivial mitral valve regurgitation. No evidence of mitral valve stenosis. Tricuspid Valve: The tricuspid valve is normal in structure. Tricuspid valve regurgitation is trivial. No evidence of tricuspid stenosis. Aortic Valve: The aortic valve is normal in structure. Aortic valve regurgitation is not visualized. No aortic stenosis is present. Pulmonic Valve: The pulmonic valve was normal in structure. Pulmonic valve regurgitation is not visualized. No evidence of pulmonic stenosis. Aorta: The aortic root is normal in size and structure. Venous: The inferior vena cava is dilated in size with less than 50% respiratory variability, suggesting right atrial pressure of 15 mmHg. IAS/Shunts: No atrial level shunt detected by color flow Doppler.  LEFT VENTRICLE PLAX 2D LVIDd:         4.80 cm  Diastology LVIDs:         3.40 cm  LV e' lateral:   10.20 cm/s LV PW:         1.00 cm  LV E/e' lateral:  5.8 LV IVS:        0.90 cm  LV e' medial:    8.05 cm/s LVOT diam:     2.20 cm  LV E/e' medial:  7.3 LV SV:         70 LV SV Index:   41 LVOT Area:     3.80 cm  RIGHT VENTRICLE RV Basal diam:  2.30 cm RV S prime:     10.10 cm/s TAPSE (M-mode): 2.5 cm LEFT ATRIUM             Index       RIGHT ATRIUM            Index LA diam:        3.20 cm 1.86 cm/m  RA Area:     13.30 cm LA Vol (A2C):   42.4 ml 24.67 ml/m RA Volume:   34.70 ml  20.19 ml/m LA Vol (A4C):   36.1 ml 21.00 ml/m LA Biplane Vol: 39.9 ml 23.21 ml/m  AORTIC VALVE LVOT Vmax:   101.00 cm/s LVOT Vmean:  71.600 cm/s LVOT VTI:    0.185 m  AORTA Ao Root diam: 2.90 cm MITRAL VALVE               TRICUSPID VALVE MV Area (PHT): 5.13 cm    TR Peak grad:   19.7 mmHg MV Decel Time: 148 msec    TR Vmax:        222.00 cm/s MV E velocity: 59.10 cm/s MV A velocity: 64.70 cm/s  SHUNTS MV E/A ratio:  0.91        Systemic VTI:  0.18 m                            Systemic Diam: 2.20 cm Mihai Croitoru MD Electronically signed by Sanda Klein MD Signature Date/Time: 07/24/2020/10:49:26 AM    Final     Discharge Exam: Blood pressure 134/83, pulse 95, temperature 97.8 F (36.6 C), temperature source Oral, resp. rate 16, height 5\' 7"  (1.702 m), weight 61.9 kg, SpO2 100 %.  General - Well nourished, well developed, in no apparent distress.  Ophthalmologic - fundi not visualized due to noncooperation.  Cardiovascular - Regular rhythm and rate.  Mental Status -  Level of arousal and orientation to time, place, and person were intact. Language including expression, naming, repetition, comprehension was assessed and found intact. Attention span and concentration were normal. Recent and remote memory were intact. Fund of Knowledge was assessed and was intact.  Cranial Nerves II - XII - II - Visual field intact OU. III, IV, VI - Extraocular movements intact. V - Facial sensation intact bilaterally. VII - Facial movement intact bilaterally. VIII - Hearing & vestibular intact bilaterally. X - Palate elevates symmetrically. XI - Chin turning & shoulder shrug intact bilaterally. XII - Tongue protrusion intact.  Motor Strength - The patient's strength was normal in all extremities and pronator drift was absent.  Bulk was normal and fasciculations  were absent.   Motor Tone - Muscle tone was assessed at the neck and appendages and was normal.  Reflexes - The patient's reflexes were symmetrical in all extremities and she had no pathological reflexes.  Sensory - Light touch, temperature/pinprick were assessed and were symmetrical.    Coordination - The patient had normal movements in the right hand and feet with no ataxia or dysmetria.  Slight hesitation of left hand finger-to-nose more  functional component. Temor  was absent.  Gait and Station - deferred.    Disposition: Discharge disposition: 01-Home or Self Care       Discharge Instructions    Ambulatory referral to Neurology   Complete by: As directed    Follow up with stroke clinic NP (Jessica Vanschaick or Cecille Rubin, if both not available, consider Zachery Dauer, or [place]) at [facility] in about 4 weeks. Thanks.   Ambulatory referral to Speech Therapy   Complete by: As directed      Allergies as of 07/24/2020   No Known Allergies     Medication List    STOP taking these medications   metroNIDAZOLE 500 MG tablet Commonly known as: FLAGYL     TAKE these medications   aspirin 81 MG chewable tablet Chew 81 mg by mouth daily.   clopidogrel 75 MG tablet Commonly known as: PLAVIX Take 1 tablet (75 mg total) by mouth daily.   ezetimibe 10 MG tablet Commonly known as: ZETIA Take 10 mg by mouth daily.   hydrochlorothiazide 25 MG tablet Commonly known as: HYDRODIURIL Take 25 mg by mouth daily.   HYDROcodone-acetaminophen 5-325 MG tablet Commonly known as: NORCO/VICODIN Take 1 tablet by mouth every 6 (six) hours as needed for moderate pain. Prescribed by Dr. Clayborne Dana with [facility].   lisinopril 10 MG tablet Commonly known as: ZESTRIL Take 10 mg by mouth daily.       Follow-up Information    [place], Samuel Germany, MD .   Contact information: [place] [address] [phone]        [facility] up.   Specialty: Rehabilitation Why: Outpatient speech.  Rehab center will call you for an appointment or you may call to schedule. Contact information: [address] [place] [id number] [place] [address] [phone]       [facility]. Schedule an appointment as soon as possible for a visit in 4 week(s).   Specialty: Neurology Contact information: [address] [place] [phone]             I spend 35 min for discharge of this pt.  Signed: Rosalin Hawking 07/24/2020, 4:22 PM

## 2020-08-28 ENCOUNTER — Ambulatory Visit: Payer: Managed Care, Other (non HMO) | Attending: Neurology

## 2020-08-28 ENCOUNTER — Other Ambulatory Visit: Payer: Self-pay

## 2020-08-28 DIAGNOSIS — R41841 Cognitive communication deficit: Secondary | ICD-10-CM | POA: Diagnosis present

## 2020-08-28 DIAGNOSIS — R4701 Aphasia: Secondary | ICD-10-CM | POA: Diagnosis present

## 2020-08-30 NOTE — Therapy (Signed)
[facility] [address] [place], [place], [address] Phone: [phone]   Fax:  [phone]  Speech Language Pathology Evaluation  Patient Details  Name: Deanna Roberts MRN: [id number] Date of Birth: 09/27/1966 Referring Provider (SLP): Rosalin Hawking   Encounter Date: 08/28/2020   End of Session - 08/30/20 1500    Visit Number 1    Number of Visits 1    Date for SLP Re-Evaluation 08/28/20    SLP Start Time 1320    SLP Stop Time  1400    SLP Time Calculation (min) 40 min    Activity Tolerance Patient tolerated treatment well           Past Medical History:  Diagnosis Date  . Cancer ([place])   . Coronary artery disease   . Diabetes mellitus without complication ([place])   . History of radiation therapy 05/30/18- 07/04/18   Left Breast 40.05 Gy in 15 fractions, Left Breast boost 5 Gy in 5 fractions.   . Hypertension   . Myocardial infarction [facility]) 2013    Past Surgical History:  Procedure Laterality Date  . ABDOMINAL HYSTERECTOMY    . BREAST LUMPECTOMY Left 2019  . BREAST LUMPECTOMY WITH RADIOACTIVE SEED LOCALIZATION Left 03/22/2018   Procedure: LEFT BREAST LUMPECTOMY WITH BRACKETED RADIOACTIVE SEED LOCALIZATION;  Surgeon: Erroll Luna, MD;  Location: [facility];  Service: General;  Laterality: Left;    There were no vitals filed for this visit.   Subjective Assessment - 08/30/20 1452    Subjective "I think it's improved. I'm able to communicate more effectively." Pt rates her language and cognition at 90% of baseline.    Currently in Pain? No/denies              SLP Evaluation [facility] - 08/30/20 1452      SLP Visit Information   SLP Received On 08/28/20    Referring Provider (SLP) Rosalin Hawking    Onset Date 07-23-20    Medical Diagnosis CVA      Subjective   Subjective "At times I may be in the middle of communicating a thought and my mind goes blank and I have trouble expressing a thought."  Pt then states she slows down and  this makes her communcation more fluid.     Patient/Family Stated Goal "Improve my - um- maybe my memory for the date, and my communicating."      General Information   HPI Pt is back to work - no difficulties reported nor have been told to her. 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 - whenever she needs to input the date she needs to look down at the bottom of the computer to get the date.    Attention Comments Pt reports she feels her focus and concentration are at baseline. No difficulty with focused or selective attention with reading online news, or listening to sermons. No difficulty reported  with taking notes longhand during client interviews and then transferring those notes to her computer. Has no trouble cooking/baking.     Memory Comments Pt reports billpay, cooking, appointment management (home and work) all appear at baseline.    Memory Appears intact      Auditory Comprehension   Overall Auditory Comprehension Appears within functional limits for tasks assessed      Verbal Expression   Overall Verbal Expression Appears within functional limits for tasks assessed      Motor Speech   Overall Motor Speech Appears within functional limits for tasks assessed                           SLP Education - 08/30/20 1500    Education Details compensations she uses are what SLP teaches people to use for more fluid conversation    Person(s) Educated  Patient    Methods Explanation    Comprehension Verbalized understanding                Plan - 08/30/20 1501    Clinical Impression Statement Pt presents today with [facility] language expression and reportedly WNL cognitive linguistics with a specific difficulty in remembering the date - she looks at the lower right of her computer when needing to type/write the date.  She is back to work for approx 10 days and has not experienced difficulty there nor has anything been brought to her attention to date. She is not lifting anything heavy at work, currently. See "SLP evaluation" section for more details. Pt reports she is at 90% of baseline and is judged by this SLP as doing too well to benfit from skilled ST at this time. SLP told pt thta if she deisred therapy in the future she could ask for another script or consider neuropsych eval.    Speech Therapy Frequency One time visit    Consulted and Agree with Plan of Care Patient           Patient will benefit from skilled therapeutic intervention in order to improve the following deficits and impairments:   Cognitive communication deficit  Aphasia    Problem List Patient Active Problem List   Diagnosis Date Noted  . Stroke (cerebrum) ([place]) 07/23/2020  . Carcinoma of upper-outer quadrant of left breast in female, estrogen receptor positive ([place]) 01/30/2018  . 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 FEVER UNSPECIFIED 03/17/2009  . ANEMIA-IRON DEFICIENCY 10/08/2007  . SICKLE CELL TRAIT 10/08/2007  . DISEASE, SICKLE CELL W/CRISIS NEC 10/08/2007  . HYPERTENSION 10/08/2007  . GLUCOSE INTOLERANCE, HX OF 10/08/2007    [facility] ,MS, CCC-SLP  08/30/2020, 3:10 PM  [facility] [address] [place], [place], [address] Phone: [phone]   Fax:  [phone]  Name: Deanna Roberts MRN: [id number] Date of Birth: 1966/04/12

## 2020-08-31 ENCOUNTER — Inpatient Hospital Stay: Payer: Self-pay | Admitting: Adult Health

## 2020-09-21 ENCOUNTER — Encounter: Payer: Self-pay | Admitting: Adult Health

## 2020-09-21 ENCOUNTER — Ambulatory Visit: Payer: Managed Care, Other (non HMO) | Admitting: Adult Health

## 2020-09-21 ENCOUNTER — Other Ambulatory Visit: Payer: Self-pay

## 2020-09-21 VITALS — BP 129/77 | HR 80 | Ht 67.0 in | Wt 142.0 lb

## 2020-09-21 DIAGNOSIS — G459 Transient cerebral ischemic attack, unspecified: Secondary | ICD-10-CM | POA: Diagnosis not present

## 2020-09-21 DIAGNOSIS — E785 Hyperlipidemia, unspecified: Secondary | ICD-10-CM | POA: Diagnosis not present

## 2020-09-21 DIAGNOSIS — G43709 Chronic migraine without aura, not intractable, without status migrainosus: Secondary | ICD-10-CM | POA: Diagnosis not present

## 2020-09-21 DIAGNOSIS — I10 Essential (primary) hypertension: Secondary | ICD-10-CM | POA: Diagnosis not present

## 2020-09-21 MED ORDER — NURTEC 75 MG PO TBDP: 75.0000 mg | ORAL_TABLET | Freq: Every day | ORAL | 0 refills | Status: DC | PRN

## 2020-09-21 NOTE — Patient Instructions (Signed)
Trial Nurtec 75mg  daily as needed for migraine management - take at onset of migraine headache. Max daily dose 75mg /24 hours. We will give you a sample to trial and if it works well for you, please call office for additional prescription   Continue aspirin 81 mg daily  and Zetia  for secondary stroke prevention  Continue to follow up with PCP regarding cholesterol and blood pressure management  Maintain strict control of hypertension with blood pressure goal below 130/90 and cholesterol with LDL cholesterol (bad cholesterol) goal below 70 mg/dL.      Followup in the future with me in 3 months or call earlier if needed       Thank you for coming to see [place] at [facility]. I hope we have been able to provide you high quality care today.  You may receive a patient satisfaction survey over the next few weeks. We would appreciate your feedback and comments so that we may continue to improve ourselves and the health of our patients.

## 2020-09-21 NOTE — Progress Notes (Signed)
[facility] [address] [place]. [place] [address] [phone]       HOSPITAL FOLLOW UP NOTE  Ms. Renie Ora Date of Birth:  June 25, 1966 Medical Record Number:  [id number]   Reason for Referral:  hospital stroke follow up    SUBJECTIVE:   CHIEF COMPLAINT:  Chief Complaint  Patient presents with  . Hospitalization Follow-up    rm 9  . Cerebrovascular Accident    Pt is having no new sx since the stroke    HPI:   Ms.Pearly Majetteis a 54 y.o.femalewith history of HTN, DM, CAD/MI in 2013, breast canceradmitted for episode of headache, neck pain, lightheadedness, confusion, nausea/vomiting, left-sided heaviness and dysmetria on 07/23/2020.  Personally reviewed hospitalization pertinent progress notes, labs and imaging.  tPA givenby telemetry neurology. CT head and MRI negative for acute abnormality and symptoms likely TIA vs complicated migraine vs anxiety.  MRA showed right M2 likely arthrosclerosis and negative for LVO.  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 Erlinda Hong, subtle appearance I did not have MCA or ACA symptoms.  Recommended DAPT for 3 weeks and aspirin alone.  HTN stable and resumed home medications at discharge.  LDL 63 and recommended continuation of Zetia 10 mg daily.  Other stroke risk factors include CAD/MI in 2013 on aspirin PTA.  Evaluated by therapy and discharged home with recommendation of outpatient SLP.  TIAvscomplicated migraine vsanxiety - s/pTPA  Presented with headache, neck pain, lightheadedness, confusion, nausea/vomiting, shortness of breath, left-sided heaviness- more nonspecific when look back  CT no acute abnormality  MRIno acute infarct  MRAright M2 likely atherosclerosis  MRI repeat no acute infarct  MRA neck no LVO, specifically no VA dissection. 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  2D EchoEF 55 to  60%  LDL63  HgbA1c 5.6  SCDsfor VTE prophylaxis  aspirin 81 mg dailyprior to admission, now on ASA and Plavix DAPT for 3 weeks and then ASA alone.  Patient counseled to be compliant withherantithrombotic medications  Ongoing aggressive stroke risk factor management  Therapy recommendations:OP SLP  Disposition:home  Today, 09/21/2020, Ms. Cambre is being seen for hospital follow-up.  She has been stable since discharge without new or reoccurring stroke/TIA symptoms.  Longstanding history of migraines without auras; she does report experiencing a migraine once every 2 weeks which can last for 2 to 3 days.  Typically located top of head on left side with a sharp stabbing sensation as well as photophobia, phonophobia, nausea and blurred vision.  She is able to maintain working but does have increased difficulty.  She does report previously being treated for migraines but unsure what medication she previously tried.  Completed 3 weeks DAPT and remains on Plavix alone without bleeding or bruising.  Remains on Zetia 10 mg daily without side effects.  Blood pressure today 129/77.  No concerns at this time.     ROS:   14 system review of systems performed and negative with exception of migraines  PMH:  Past Medical History:  Diagnosis Date  . Cancer ([place])   . Coronary artery disease   . Diabetes mellitus without complication ([place])   . History of radiation therapy 05/30/18- 07/04/18   Left Breast 40.05 Gy in 15 fractions, Left Breast boost 5 Gy in 5 fractions.   . Hypertension   . Myocardial infarction [facility]) 2013    PSH:  Past Surgical History:  Procedure Laterality  Date  . ABDOMINAL HYSTERECTOMY    . BREAST LUMPECTOMY Left 2019  . BREAST LUMPECTOMY WITH RADIOACTIVE SEED LOCALIZATION Left 03/22/2018   Procedure: LEFT BREAST LUMPECTOMY WITH BRACKETED RADIOACTIVE SEED LOCALIZATION;  Surgeon: Erroll Luna, MD;  Location: [facility];  Service: General;   Laterality: Left;    Social History:  Social History   Socioeconomic History  . Marital status: Single    Spouse name: Not on file  . Number of children: Not on file  . Years of education: Not on file  . Highest education level: Not on file  Occupational History  . Not on file  Tobacco Use  . Smoking status: Never Smoker  . Smokeless tobacco: Never Used  . Tobacco comment: she smoked in college  Vaping Use  . Vaping Use: Never used  Substance and Sexual Activity  . Alcohol use: Yes    Comment: occasional  . Drug use: No  . Sexual activity: Not on file  Other Topics Concern  . Not on file  Social History Narrative  . Not on file   Social Determinants of Health   Financial Resource Strain:   . Difficulty of Paying Living Expenses: Not on file  Food Insecurity:   . Worried About Charity fundraiser in the Last Year: Not on file  . Ran Out of Food in the Last Year: Not on file  Transportation Needs:   . Lack of Transportation (Medical): Not on file  . Lack of Transportation (Non-Medical): Not on file  Physical Activity:   . Days of Exercise per Week: Not on file  . Minutes of Exercise per Session: Not on file  Stress:   . Feeling of Stress : Not on file  Social Connections:   . Frequency of Communication with Friends and Family: Not on file  . Frequency of Social Gatherings with Friends and Family: Not on file  . Attends Religious Services: Not on file  . Active Member of Clubs or Organizations: Not on file  . Attends Archivist Meetings: Not on file  . Marital Status: Not on file  Intimate Partner Violence:   . Fear of Current or Ex-Partner: Not on file  . Emotionally Abused: Not on file  . Physically Abused: Not on file  . Sexually Abused: Not on file    Family History:  Family History  Problem Relation Age of Onset  . Heart attack Mother   . Breast cancer Mother 70  . Multiple sclerosis Sister   . Multiple sclerosis Sister     Medications:    Current Outpatient Medications on File Prior to Visit  Medication Sig Dispense Refill  . aspirin 81 MG chewable tablet Chew 81 mg by mouth daily.    . clopidogrel (PLAVIX) 75 MG tablet Take 1 tablet (75 mg total) by mouth daily. 21 tablet 0  . ezetimibe (ZETIA) 10 MG tablet Take 10 mg by mouth daily.   4  . hydrochlorothiazide (HYDRODIURIL) 25 MG tablet Take 25 mg by mouth daily.    Marland Kitchen HYDROcodone-acetaminophen (NORCO/VICODIN) 5-325 MG tablet Take 1 tablet by mouth every 6 (six) hours as needed for moderate pain. Prescribed by Dr. Clayborne Dana with [facility].    Marland Kitchen lisinopril (PRINIVIL,ZESTRIL) 10 MG tablet Take 10 mg by mouth daily.     No current facility-administered medications on file prior to visit.    Allergies:  No Known Allergies    OBJECTIVE:  Physical Exam  Vitals:   09/21/20  1333  BP: 129/77  Pulse: 80  Weight: 142 lb (64.4 kg)  Height: 5\' 7"  (1.702 m)   Body mass index is 22.24 kg/m. No exam data present  General: well developed, well nourished,  pleasant middle-aged African-American female, seated, in no evident distress Head: head normocephalic and atraumatic.   Neck: supple with no carotid or supraclavicular bruits Cardiovascular: regular rate and rhythm, no murmurs Musculoskeletal: no deformity Skin:  no rash/petichiae Vascular:  Normal pulses all extremities   Neurologic Exam Mental Status: Awake and fully alert.   Fluent speech and language.  Oriented to place and time. Recent and remote memory intact. Attention span, concentration and fund of knowledge appropriate. Mood and affect appropriate.  Cranial Nerves: Fundoscopic exam reveals sharp disc margins. Pupils equal, briskly reactive to light. Extraocular movements full without nystagmus. Visual fields full to confrontation. Hearing intact. Facial sensation intact. Face, tongue, palate moves normally and symmetrically.  Motor: Normal bulk and tone. Normal strength in all tested extremity  muscles. Sensory.: intact to touch , pinprick , position and vibratory sensation.  Coordination: Rapid alternating movements normal in all extremities. Finger-to-nose and heel-to-shin performed accurately bilaterally. Gait and Station: Arises from chair without difficulty. Stance is normal. Gait demonstrates normal stride length and balance Reflexes: 1+ and symmetric. Toes downgoing.     NIHSS  0 Modified Rankin  0     ASSESSMENT: Jania Steinke is a 54 y.o. year old female presented with episode of headache, neck pain, lightheadedness, confusion, nausea/vomiting, left-sided heaviness and dysmetria on 07/23/2020 receiving TPA.  Stroke work-up negative for acute abnormality and likely TIA vs complicated migraine vs anxiety. Vascular risk factors include chronic migraines, HTN, HLD and CAD/MI 2013.      PLAN:  1. TIA s/p tPA :  a. recovered without residual deficit.  b. Continue aspirin 81 mg daily  and Zetia 10 mg daily for secondary stroke prevention.  c. Discussed secondary stroke prevention measures and importance of close PCP follow up for aggressive stroke risk factor management  2. Chronic migraine:  a. Chronic history experiencing a migraine 2 times monthly lasting for 2 to 3 days.   b. Recommend trialing Nurtec for emergent migraine relief.  She was provided samples and will call office if beneficial for further prescribing.   c. Unable to place on triptan due to recent TIA.   d. Migraines not frequent enough to warrant daily medication use. 3. HTN: BP goal <130/90.  Stable.  On lisinopril and hydrochlorothiazide per PCP 4. HLD: LDL goal <70. Recent LDL 63.  Continue Zetia 10 mg daily per PCP    Follow up in 3 months or call earlier if needed   I spent 45 minutes of face-to-face and non-face-to-face time with patient.  This included previsit chart review, lab review, study review, order entry, electronic health record documentation, patient education regarding recent TIA,  complicated migraine, importance of managing stroke risk factors and answered all questions to patient satisfaction   Frann Rider, [facility]  [facility] [address] [place], [place] [address]  Phone [phone] Fax [phone] Note: This document was prepared with digital dictation and possible smart phrase technology. Any transcriptional errors that result from this process are unintentional.

## 2020-09-23 NOTE — Progress Notes (Signed)
I agree with the above plan 

## 2020-10-12 ENCOUNTER — Ambulatory Visit: Payer: Managed Care, Other (non HMO) | Attending: Internal Medicine

## 2020-10-12 DIAGNOSIS — Z23 Encounter for immunization: Secondary | ICD-10-CM

## 2020-10-12 NOTE — Progress Notes (Signed)
   Covid-19 Vaccination Clinic  Name:  Crimson Dubberly    MRN: [id number] DOB: [birth year]  10/12/2020  Ms. Monie was observed post Covid-19 immunization for 15 minutes without incident. She was provided with Vaccine Information Sheet and instruction to access the V-Safe system.   Ms. Conklin was instructed to call 911 with any severe reactions post vaccine: Marland Kitchen Difficulty breathing  . Swelling of face and throat  . A fast heartbeat  . A bad rash all over body  . Dizziness and weakness

## 2020-10-14 ENCOUNTER — Encounter: Payer: Self-pay | Admitting: *Deleted

## 2020-10-14 NOTE — Progress Notes (Signed)
[facility] - 10/14/20 1500      Assessment    Assessment type Phone to patient   No Answer, left voice mail

## 2020-10-21 ENCOUNTER — Other Ambulatory Visit: Payer: Self-pay

## 2020-10-21 ENCOUNTER — Encounter: Payer: Self-pay | Admitting: *Deleted

## 2020-10-21 NOTE — Patient Outreach (Signed)
[facility]) Care Management  10/21/2020  Deanna Roberts 1966-05-04 [id number]   First telephone outreach attempt to obtain mRS. No answer. Left message for returned call.  Philmore Pali under supervision of [employer] [facility] Management Assistant [phone]

## 2020-10-27 ENCOUNTER — Encounter: Payer: Self-pay | Admitting: *Deleted

## 2020-10-27 ENCOUNTER — Other Ambulatory Visit: Payer: Self-pay | Admitting: Adult Health

## 2020-10-27 MED ORDER — NURTEC 75 MG PO TBDP: 75.0000 mg | ORAL_TABLET | Freq: Every day | ORAL | 6 refills | Status: DC | PRN

## 2020-10-27 NOTE — Addendum Note (Signed)
Addended by: Brandon Melnick on: 10/27/2020 02:03 PM   Modules accepted: Orders

## 2020-10-27 NOTE — Telephone Encounter (Signed)
Pt is requesting a refill for Rimegepant Sulfate (NURTEC) 75 MG TBDP.  Pharmacy:  [facility]

## 2020-10-30 ENCOUNTER — Other Ambulatory Visit: Payer: Self-pay

## 2020-10-30 NOTE — Patient Outreach (Signed)
[facility]) Care Management  10/30/2020  Deanna Roberts [birth year] [id number]   Second telephone outreach attempt to obtain mRS. No answer. Left message for returned call.  Philmore Pali [facility] Management Assistant [phone]

## 2020-11-03 ENCOUNTER — Other Ambulatory Visit: Payer: Self-pay

## 2020-11-03 NOTE — Patient Outreach (Signed)
[facility]) Care Management  11/03/2020  Laberta Wilbon May 29, 1966 [id number]   3 outreach attempts were completed to obtain mRs. mRs could not be obtained because patient never returned my calls. mRs=7    [facility] Management Assistant [phone]

## 2020-11-09 ENCOUNTER — Telehealth: Payer: Self-pay | Admitting: Adult Health

## 2020-11-09 NOTE — Telephone Encounter (Signed)
Received a fax from patient's pharmacy stating Nurtec needed a PA. PA was started on [email or web address]. Key is [id number]. PA was instantly approved 11/09/20-11/09/21. Will fax a copy of the determination once it has been received.

## 2021-01-04 ENCOUNTER — Other Ambulatory Visit: Payer: Self-pay

## 2021-01-04 ENCOUNTER — Ambulatory Visit
Admission: RE | Admit: 2021-01-04 | Discharge: 2021-01-04 | Disposition: A | Discharge status: Home / Self Care | Payer: Managed Care, Other (non HMO) | Source: Ambulatory Visit | Attending: Nurse Practitioner | Admitting: Nurse Practitioner

## 2021-01-04 DIAGNOSIS — Z9889 Other specified postprocedural states: Secondary | ICD-10-CM

## 2021-01-27 NOTE — Progress Notes (Incomplete)
[place]   Telephone:([phone]) [phone] [phone]   Clinic Follow up Note   Patient Care Team: Kathreen Devoid, PA-C as PCP - General (Internal Medicine) Erroll Luna, MD as Consulting Physician (General Surgery) Truitt Merle, MD as Consulting Physician (Hematology) Delice Bison Charlestine Massed, NP as Nurse Practitioner (Hematology and Oncology)  Date of Service:  01/27/2021  CHIEF COMPLAINT: F/u of left breast DCIS  SUMMARY OF ONCOLOGIC HISTORY: Oncology History Overview Note  Cancer Staging Ductal carcinoma in situ (DCIS) of left breast Staging form: Breast, AJCC 8th Edition - Clinical stage from 01/04/2018: Stage 0 (cTis (DCIS), cN0, cM0, ER: Positive, PR: Positive, HER2: Not assessed ) - Signed by Truitt Merle, MD on 01/26/2018 - Pathologic: Stage 0 (pTis (DCIS), pN0, cM0, ER+, PR+, HER2-) - Signed by Truitt Merle, MD on 07/19/2018     Ductal carcinoma in situ (DCIS) of left breast  12/27/2017 Mammogram   IMPRESSION: Indeterminate calcifications in the upper-outer quadrant of the left breast.   01/04/2018 Receptors her2   Results:Breast, left, needle core biopsy, upper outer quadrant at posterior depth Estrogen Receptor: 95%, POSITIVE, MODERATE STAINING INTENSITY Progesterone Receptor: 2%, POSITIVE, WEAK STAINING INTENSITY Results: Breast, left, needle core biopsy, UOQ Estrogen Receptor: 100%, POSITIVE, STRONG STAINING INTENSITY Progesterone Receptor: 30%, POSITIVE, MODERATE STAINING INTENSITY   01/04/2018 Initial Biopsy   Diagnosis 01/04/18 Breast, left, needle core biopsy, upper outer quadrant at posterior depth - DUCTAL CARCINOMA IN SITU WITH CALCIFICATIONS, SEE COMMENT. Microscopic Comment The carcinoma appears intermediate grade. Prognostic markers will be ordered. The case was called to [facility] on 01/05/2018. Diagnosis 01/04/18 Breast, left, needle core biopsy, UOQ - DUCTAL CARCINOMA IN SITU WITH CALCIFICATIONS. - SEE  COMMENT. Microscopic Comment The carcinoma appears intermediate grade. Estrogen receptor and progesterone receptor studies will be performed and the results reported separately. The results were called to the [facility] on 01/02/2018.      03/22/2018 Surgery   LEFT BREAST LUMPECTOMY WITH BRACKETED RADIOACTIVE SEED LOCALIZATION by Dr. Brantley Stage  03/22/18    03/22/2018 Pathology Results   Diagnosis 03/22/18 Breast, lumpectomy, Left w/seed x2 - MULTIFOCAL DUCTAL CARCINOMA IN SITU. - DCIS FOCALLY LESS THAN 0.1 CM FROM POSTERIOR MARGIN, 0.1 CM FROM LATERAL AND SUPERIOR MARGIN AND 0.3 CM FROM ANTERIOR MARGIN. - PREVIOUS BIOPSY SITES AND BIOPSY CLIPS. - FIBROCYSTIC CHANGES WITH CALCIFICATIONS. - NO INVASIVE CARCINOMA   05/04/2018 Mammogram   05/04/2018 Mammogram IMPRESSION: 1. Status post left lumpectomy with surgical absence of the previously biopsied malignant left breast calcifications. 2. Stable small number of scattered, tiny, benign calcifications elsewhere in the left breast. 3. No evidence of malignancy   05/30/2018 - 07/04/2018 Radiation Therapy   She completed adjuvant radiation with Dr. Isidore Moos, 05/30/18-07/04/18.    07/19/2018 Cancer Staging   Staging form: Breast, AJCC 8th Edition - Pathologic: Stage 0 (pTis (DCIS), pN0, cM0, ER+, PR+, HER2-) - Signed by Truitt Merle, MD on 07/19/2018      CURRENT THERAPY:  ***  INTERVAL HISTORY: *** Deanna Roberts is here for a follow up of left brast DCIS. She was last seen by me 1 year ago.   REVIEW OF SYSTEMS:  *** Constitutional: Denies fevers, chills or abnormal weight loss Eyes: Denies blurriness of vision Ears, nose, mouth, throat, and face: Denies mucositis or sore throat Respiratory: Denies cough, dyspnea or wheezes Cardiovascular: Denies palpitation, chest discomfort or lower extremity swelling Gastrointestinal:  Denies nausea, heartburn or change in bowel habits Skin: Denies abnormal skin rashes Lymphatics:  Denies  new lymphadenopathy or easy bruising Neurological:Denies numbness, tingling or new weaknesses Behavioral/Psych: Mood is stable, no new changes  All other systems were reviewed with the patient and are negative.  MEDICAL HISTORY:  Past Medical History:  Diagnosis Date  . Cancer ([place])   . Coronary artery disease   . Diabetes mellitus without complication ([place])   . History of radiation therapy 05/30/18- 07/04/18   Left Breast 40.05 Gy in 15 fractions, Left Breast boost 5 Gy in 5 fractions.   . Hypertension   . Myocardial infarction [facility]) 2013    SURGICAL HISTORY: Past Surgical History:  Procedure Laterality Date  . ABDOMINAL HYSTERECTOMY    . BREAST LUMPECTOMY Left 2019  . BREAST LUMPECTOMY WITH RADIOACTIVE SEED LOCALIZATION Left 03/22/2018   Procedure: LEFT BREAST LUMPECTOMY WITH BRACKETED RADIOACTIVE SEED LOCALIZATION;  Surgeon: Erroll Luna, MD;  Location: [facility];  Service: General;  Laterality: Left;    I have reviewed the social history and family history with the patient and they are unchanged from previous note.  ALLERGIES:  has No Known Allergies.  MEDICATIONS:  Current Outpatient Medications  Medication Sig Dispense Refill  . aspirin 81 MG chewable tablet Chew 81 mg by mouth daily.    Marland Kitchen ezetimibe (ZETIA) 10 MG tablet Take 10 mg by mouth daily.   4  . hydrochlorothiazide (HYDRODIURIL) 25 MG tablet Take 25 mg by mouth daily.    Marland Kitchen HYDROcodone-acetaminophen (NORCO/VICODIN) 5-325 MG tablet Take 1 tablet by mouth every 6 (six) hours as needed for moderate pain. Prescribed by Dr. Clayborne Dana with [facility].    Marland Kitchen lisinopril (PRINIVIL,ZESTRIL) 10 MG tablet Take 10 mg by mouth daily.    . Rimegepant Sulfate (NURTEC) 75 MG TBDP Take 75 mg by mouth daily as needed (acute migraine onset. max dose 56m/24hr). 10 tablet 6   No current facility-administered medications for this visit.    PHYSICAL EXAMINATION: ECOG PERFORMANCE STATUS: {CHL ONC ECOG  PS:[phone]}  There were no vitals filed for this visit. There were no vitals filed for this visit. *** GENERAL:alert, no distress and comfortable SKIN: skin color, texture, turgor are normal, no rashes or significant lesions EYES: normal, Conjunctiva are pink and non-injected, sclera clear {OROPHARYNX:no exudate, no erythema and lips, buccal mucosa, and tongue normal}  NECK: supple, thyroid normal size, non-tender, without nodularity LYMPH:  no palpable lymphadenopathy in the cervical, axillary {or inguinal} LUNGS: clear to auscultation and percussion with normal breathing effort HEART: regular rate & rhythm and no murmurs and no lower extremity edema ABDOMEN:abdomen soft, non-tender and normal bowel sounds Musculoskeletal:no cyanosis of digits and no clubbing  NEURO: alert & oriented x 3 with fluent speech, no focal motor/sensory deficits  LABORATORY DATA:  I have reviewed the data as listed CBC Latest Ref Rng & Units 07/23/2020 01/31/2020 03/10/2009  WBC 4.0 - 10.5 K/uL 3.1(L) 2.9(L) 3.7(L)  Hemoglobin 12.0 - 15.0 g/dL 13.1 11.4(L) 11.1(L)  Hematocrit 36.0 - 46.0 % 39.9 33.8(L) 33.1(L)  Platelets 150 - 400 K/uL 190 169 184.0     CMP Latest Ref Rng & Units 07/23/2020 01/31/2020 03/16/2018  Glucose 70 - 99 mg/dL 111(H) 82 78  BUN 6 - 20 mg/dL 8 8 <5(L)  Creatinine 0.44 - 1.00 mg/dL 0.67 0.73 0.66  Sodium 135 - 145 mmol/L 138 144 140  Potassium 3.5 - 5.1 mmol/L 4.2 4.2 3.9  Chloride 98 - 111 mmol/L 102 107 106  CO2 22 - 32 mmol/L 26 29 24   Calcium 8.9 -  10.3 mg/dL 9.2 8.5(L) 8.7(L)  Total Protein 6.5 - 8.1 g/dL 7.5 6.1(L) -  Total Bilirubin 0.3 - 1.2 mg/dL 0.5 0.3 -  Alkaline Phos 38 - 126 U/L 71 67 -  AST 15 - 41 U/L 21 16 -  ALT 0 - 44 U/L 12 8 -      RADIOGRAPHIC STUDIES: I have personally reviewed the radiological images as listed and agreed with the findings in the report. No results found.   ASSESSMENT & PLAN:  Syniah Berne is a 55 y.o. female with     1.Leftbreast DCIS, grade2, ER+/PR + -Deanna Roberts is clinically doing well. She underwent left lumpectomy in 03/2018 and completed adjuvant radiation in 06/2018. She is healing well.  -She has a 3 mm tender nodule in the left breast that likely corresponds with the area of fat necrosis seen on recent mammogram. No other concerning findings seen on mammogram. Per radiology, it was recommended that she have a 6 month follow up mammogram, which is already scheduled for 07/02/2020.  -GivenER/PR positive disease, at her last appointment in 09/2018, Dr. Burr Medico recommended adjuvant aromatase inhibitor to reduce her risk of cancer recurrence,she is menopausal s/p hysterectomy.  -The potential benefit and side effects, which include but not limited to hot flash, skin and vaginal dryness, metabolic changes ( increased blood glucose, cholesterol, weight, etc.), slightly in increased risk of cardiovascular disease, cataracts, muscular and joint discomfort, osteopenia and osteoporosis, etc were discussed with her in great details. She is not interested based on the side effect profile.She does agree to routine mammogram and surveillance f/u with [place].  -I encouraged her to f/u with PCP for routine health maintenance, exercise often, and eat well -She will call if she decides to pursue adjuvant AI. -She will obtain her mammogram in 6 months in 06/2020 and a follow up with [place] in 1 year or earlier depending on if there are any concerning findings seen on her next mammogram.  2.Anemia -The patient's repeat lab work demonstrated persistent and stable anemia likely related to her known history of IDA and sickle cell trait. She is asymptomatic. Will continue on observation  3. Leukocytopenia -She continues to have persistent and stable leukocytopenia that, per chart review,  Has waxed and waned over the last decade. Patient denies any frequent or recent infection. Likely benign and her baseline. Will continue to  monitor.    PLAN -Mammogram on 07/02/2020 -Return in 1 year for f/u with Dr. Burr Medico -Patient will call if she decides to pursue adjuvant AI   No problem-specific Assessment & Plan notes found for this encounter.   No orders of the defined types were placed in this encounter.  All questions were answered. The patient knows to call the clinic with any problems, questions or concerns. No barriers to learning was detected. The total time spent in the appointment was {CHL ONC TIME VISIT - YELYH:[id number]}.     Joslyn Devon 01/27/2021   Oneal Deputy, am acting as scribe for Truitt Merle, MD.   {Add scribe attestation statement}

## 2021-01-29 ENCOUNTER — Inpatient Hospital Stay: Payer: Managed Care, Other (non HMO) | Attending: Hematology | Admitting: Hematology

## 2021-01-29 DIAGNOSIS — D0512 Intraductal carcinoma in situ of left breast: Secondary | ICD-10-CM

## 2021-02-01 ENCOUNTER — Ambulatory Visit: Payer: Managed Care, Other (non HMO) | Admitting: Adult Health

## 2021-03-01 ENCOUNTER — Encounter: Payer: Self-pay | Admitting: Adult Health

## 2021-03-01 ENCOUNTER — Ambulatory Visit: Payer: Managed Care, Other (non HMO) | Admitting: Adult Health

## 2021-03-01 VITALS — BP 127/79 | HR 77 | Ht 67.0 in | Wt 147.0 lb

## 2021-03-01 DIAGNOSIS — G459 Transient cerebral ischemic attack, unspecified: Secondary | ICD-10-CM

## 2021-03-01 DIAGNOSIS — G43709 Chronic migraine without aura, not intractable, without status migrainosus: Secondary | ICD-10-CM

## 2021-03-01 NOTE — Patient Instructions (Signed)
Continue clopidogrel 75 mg daily  and Zetia  for secondary stroke prevention  Continue Nurtec for emergent headache relief  Continue to follow up with PCP regarding cholesterol and blood pressure management  Maintain strict control of hypertension with blood pressure goal below 130/90, diabetes with hemoglobin A1c goal below 6.5% and cholesterol with LDL cholesterol (bad cholesterol) goal below 70 mg/dL.       Followup in the future with me in 6 months or call earlier if needed       Thank you for coming to see [place] at [facility]. I hope we have been able to provide you high quality care today.  You may receive a patient satisfaction survey over the next few weeks. We would appreciate your feedback and comments so that we may continue to improve ourselves and the health of our patients.

## 2021-03-01 NOTE — Progress Notes (Signed)
[facility] [address] [place]. [place] [address] [phone]       HOSPITAL FOLLOW UP NOTE  [name] Date of Birth:  11-22-1966 Medical Record Number:  [id number]   Reason for Referral:  hospital stroke follow up    SUBJECTIVE:   CHIEF COMPLAINT:  Chief Complaint  Patient presents with  . Follow-up    TR alone Pt still has some headaches occassionally but doing well    HPI:   Today, 03/01/2021, Deanna Roberts returns for TIA follow-up.  Stable since prior visit without new or reoccurring stroke/TIA symptoms Compliant on aspirin and Zetia -denies side effects Blood pressure today 127/79 - does not monitor at home as typically stable  Chronic migraines stable - continues to have approx 2 migraines per month - use of Nurtec acutely typically subsiding migraine within 30 minutes   No new concerns at this time    History provided for reference purposes only Initial visit 09/21/2020 JM: Deanna Roberts is being seen for hospital follow-up.  She has been stable since discharge without new or reoccurring stroke/TIA symptoms.  Longstanding history of migraines without auras; she does report experiencing a migraine once every 2 weeks which can last for 2 to 3 days.  Typically located top of head on left side with a sharp stabbing sensation as well as photophobia, phonophobia, nausea and blurred vision.  She is able to maintain working but does have increased difficulty.  She does report previously being treated for migraines but unsure what medication she previously tried.  Completed 3 weeks DAPT and remains on aspirin alone without bleeding or bruising.  Remains on Zetia 10 mg daily without side effects.  Blood pressure today 129/77.  No concerns at this time.  TIA admission 07/23/2020 DeannaDeanna Roberts a 55 y.o.femalewith history of HTN, DM, CAD/MI in 2013, breast canceradmitted for episode of headache, neck pain, lightheadedness, confusion,  nausea/vomiting, left-sided heaviness and dysmetria on 07/23/2020.  Personally reviewed hospitalization pertinent progress notes, labs and imaging.  tPA givenby telemetry neurology. CT head and MRI negative for acute abnormality and symptoms likely TIA vs complicated migraine vs anxiety.  MRA showed right M2 likely arthrosclerosis and negative for LVO.  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 Erlinda Hong, subtle appearance I did not have MCA or ACA symptoms.  Recommended DAPT for 3 weeks and aspirin alone.  HTN stable and resumed home medications at discharge.  LDL 63 and recommended continuation of Zetia 10 mg daily.  Other stroke risk factors include CAD/MI in 2013 on aspirin PTA.  Evaluated by therapy and discharged home with recommendation of outpatient SLP.  TIAvscomplicated migraine vsanxiety - s/pTPA  Presented with headache, neck pain, lightheadedness, confusion, nausea/vomiting, shortness of breath, left-sided heaviness- more nonspecific when look back  CT no acute abnormality  MRIno acute infarct  MRAright M2 likely atherosclerosis  MRI repeat no acute infarct  MRA neck no LVO, specifically no VA dissection. 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  2D EchoEF 55 to 60%  LDL63  HgbA1c 5.6  SCDsfor VTE prophylaxis  aspirin 81 mg dailyprior to admission, now on ASA and Plavix DAPT for 3 weeks and then ASA alone.  Patient counseled to be compliant withherantithrombotic medications  Ongoing aggressive stroke risk factor management  Therapy recommendations:OP SLP  Disposition:home      ROS:   14 system review of systems performed and negative  with exception of migraines  PMH:  Past Medical History:  Diagnosis Date  . Cancer ([place])   . Coronary artery disease   . Diabetes mellitus without complication ([place])   . History of radiation therapy 05/30/18- 07/04/18   Left Breast  40.05 Gy in 15 fractions, Left Breast boost 5 Gy in 5 fractions.   . Hypertension   . Myocardial infarction [facility]) 2013    PSH:  Past Surgical History:  Procedure Laterality Date  . ABDOMINAL HYSTERECTOMY    . BREAST LUMPECTOMY Left 2019  . BREAST LUMPECTOMY WITH RADIOACTIVE SEED LOCALIZATION Left 03/22/2018   Procedure: LEFT BREAST LUMPECTOMY WITH BRACKETED RADIOACTIVE SEED LOCALIZATION;  Surgeon: Erroll Luna, MD;  Location: [facility];  Service: General;  Laterality: Left;    Social History:  Social History   Socioeconomic History  . Marital status: Single    Spouse name: Not on file  . Number of children: Not on file  . Years of education: Not on file  . Highest education level: Not on file  Occupational History  . Not on file  Tobacco Use  . Smoking status: Never Smoker  . Smokeless tobacco: Never Used  . Tobacco comment: she smoked in college  Vaping Use  . Vaping Use: Never used  Substance and Sexual Activity  . Alcohol use: Yes    Comment: occasional  . Drug use: No  . Sexual activity: Not on file  Other Topics Concern  . Not on file  Social History Narrative  . Not on file   Social Determinants of Health   Financial Resource Strain: Not on file  Food Insecurity: Not on file  Transportation Needs: Not on file  Physical Activity: Not on file  Stress: Not on file  Social Connections: Not on file  Intimate Partner Violence: Not on file    Family History:  Family History  Problem Relation Age of Onset  . Heart attack Mother   . Breast cancer Mother 33  . Multiple sclerosis Sister   . Multiple sclerosis Sister     Medications:   Current Outpatient Medications on File Prior to Visit  Medication Sig Dispense Refill  . aspirin 81 MG chewable tablet Chew 81 mg by mouth daily.    Marland Kitchen ezetimibe (ZETIA) 10 MG tablet Take 10 mg by mouth daily.   4  . hydrochlorothiazide (HYDRODIURIL) 25 MG tablet Take 25 mg by mouth daily.    Marland Kitchen  HYDROcodone-acetaminophen (NORCO/VICODIN) 5-325 MG tablet Take 1 tablet by mouth every 6 (six) hours as needed for moderate pain. Prescribed by Dr. Clayborne Dana with [facility].    Marland Kitchen lisinopril (PRINIVIL,ZESTRIL) 10 MG tablet Take 10 mg by mouth daily.    . Rimegepant Sulfate (NURTEC) 75 MG TBDP Take 75 mg by mouth daily as needed (acute migraine onset. max dose 75mg /24hr). 10 tablet 6   No current facility-administered medications on file prior to visit.    Allergies:  No Known Allergies    OBJECTIVE:  Physical Exam  Vitals:   03/01/21 1359  BP: 127/79  Pulse: 77  Weight: 147 lb (66.7 kg)  Height: 5\' 7"  (1.702 m)   Body mass index is 23.02 kg/m. No exam data present  General: well developed, well nourished,  pleasant middle-aged African-American female, seated, in no evident distress Head: head normocephalic and atraumatic.   Neck: supple with no carotid or supraclavicular bruits Cardiovascular: regular rate and rhythm, no murmurs Musculoskeletal: no deformity Skin:  no rash/petichiae Vascular:  Normal pulses all extremities   Neurologic Exam Mental Status: Awake and fully alert.   Fluent speech and language.  Oriented to place and time. Recent and remote memory intact. Attention span, concentration and fund of knowledge appropriate. Mood and affect appropriate.  Cranial Nerves: Pupils equal, briskly reactive to light. Extraocular movements full without nystagmus. Visual fields full to confrontation. Hearing intact. Facial sensation intact. Face, tongue, palate moves normally and symmetrically.  Motor: Normal bulk and tone. Normal strength in all tested extremity muscles. Sensory.: intact to touch , pinprick , position and vibratory sensation.  Coordination: Rapid alternating movements normal in all extremities. Finger-to-nose and heel-to-shin performed accurately bilaterally. Gait and Station: Arises from chair without difficulty. Stance is normal. Gait  demonstrates normal stride length and balance Reflexes: 1+ and symmetric. Toes downgoing.         ASSESSMENT: Deanna Roberts is a 55 y.o. year old female presented with episode of headache, neck pain, lightheadedness, confusion, nausea/vomiting, left-sided heaviness and dysmetria on 07/23/2020 receiving TPA.  Stroke work-up negative for acute abnormality and likely TIA vs complicated migraine vs anxiety. Vascular risk factors include chronic migraines, HTN, HLD and CAD/MI 2013.      PLAN:  1. TIA s/p tPA :  a. No new or reoccurring stroke/TIA symptoms b. Continue aspirin 81 mg daily  and Zetia 10 mg daily for secondary stroke prevention.  c. Discussed secondary stroke prevention measures and importance of close PCP follow up for aggressive stroke risk factor management  d. HTN: BP goal <130/90.  Well controlled on lisinopril and hydrochlorothiazide per PCP e. HLD: LDL goal <70. prior LDL 63.  Continue Zetia 10 mg daily per PCP.  Plans to follow-up with PCP in the near future for repeat lipid panel 2. Chronic migraine:  a. Chronic history experiencing typically 2 migraines per month b. Ongoing use of Nurtec 75 mg acutely - refill up to date - PA approved thru 10/2021 c. Unable to place on triptan due to recent TIA.   d. Migraines not frequent enough to warrant daily medication use     Follow up in 6 months or call earlier if needed   CC:  [place] provider: Dr. Nelson Chimes, Mikeal Hawthorne, MD    I spent 35 minutes of face-to-face and non-face-to-face time with patient.  This included previsit chart review, lab review, study review, order entry, electronic health record documentation, patient education regarding TIA, complicated migraine and use of Nurtec, importance of managing stroke risk factors and answered all other questions to patient satisfaction  Frann Rider, AGNP-BC  [facility] [address] [place], [place] [address]  Phone [phone] Fax  [phone] Note: This document was prepared with digital dictation and possible smart phrase technology. Any transcriptional errors that result from this process are unintentional.

## 2021-03-02 NOTE — Progress Notes (Signed)
I agree with the above plan 

## 2021-09-06 ENCOUNTER — Ambulatory Visit: Payer: Managed Care, Other (non HMO) | Admitting: Adult Health

## 2021-10-27 ENCOUNTER — Telehealth: Payer: Self-pay | Admitting: *Deleted

## 2021-10-27 NOTE — Telephone Encounter (Addendum)
Nurtec PA,  key: [id number]. Your information has been submitted and will be reviewed by [place].  An electronic determination will be received in CoverMyMeds within 72-120 hours.You will receive a fax copy of the determination. If Christella Scheuermann has not responded in 120 hours, contact [employer] at [phone]. Immediately approved, [id number];[id number] Start Date:10/27/2021; Coverage End Date:10/27/2022.

## 2021-11-25 ENCOUNTER — Ambulatory Visit: Payer: Managed Care, Other (non HMO) | Admitting: Adult Health

## 2021-12-08 ENCOUNTER — Other Ambulatory Visit: Payer: Self-pay | Admitting: Internal Medicine

## 2021-12-08 DIAGNOSIS — Z1231 Encounter for screening mammogram for malignant neoplasm of breast: Secondary | ICD-10-CM

## 2021-12-13 ENCOUNTER — Other Ambulatory Visit: Payer: Self-pay | Admitting: Adult Health

## 2022-01-07 ENCOUNTER — Ambulatory Visit: Payer: Managed Care, Other (non HMO)

## 2022-01-14 ENCOUNTER — Ambulatory Visit: Payer: Managed Care, Other (non HMO)

## 2022-01-21 ENCOUNTER — Ambulatory Visit
Admission: RE | Admit: 2022-01-21 | Discharge: 2022-01-21 | Disposition: A | Discharge status: Home / Self Care | Payer: Managed Care, Other (non HMO) | Source: Ambulatory Visit | Attending: Internal Medicine | Admitting: Internal Medicine

## 2022-01-21 DIAGNOSIS — Z1231 Encounter for screening mammogram for malignant neoplasm of breast: Secondary | ICD-10-CM

## 2022-02-14 ENCOUNTER — Ambulatory Visit: Payer: Managed Care, Other (non HMO) | Admitting: Adult Health

## 2022-11-10 NOTE — Telephone Encounter (Signed)
New PA submitted via [place] Approved; Coverage Start Date:11/10/2022;Coverage End Date:11/10/2023;

## 2022-12-20 ENCOUNTER — Other Ambulatory Visit: Payer: Self-pay | Admitting: Adult Health

## 2023-03-03 ENCOUNTER — Other Ambulatory Visit: Payer: Self-pay

## 2023-03-03 DIAGNOSIS — Z1231 Encounter for screening mammogram for malignant neoplasm of breast: Secondary | ICD-10-CM

## 2023-03-24 ENCOUNTER — Ambulatory Visit
Admission: RE | Admit: 2023-03-24 | Discharge: 2023-03-24 | Disposition: A | Discharge status: Home / Self Care | Payer: Managed Care, Other (non HMO) | Source: Ambulatory Visit | Attending: Internal Medicine | Admitting: Internal Medicine

## 2023-03-24 DIAGNOSIS — Z1231 Encounter for screening mammogram for malignant neoplasm of breast: Secondary | ICD-10-CM

## 2023-03-24 HISTORY — DX: Malignant neoplasm of unspecified site of unspecified female breast: C50.919

## 2023-11-19 ENCOUNTER — Other Ambulatory Visit: Payer: Self-pay | Admitting: Neurology

## 2023-11-20 ENCOUNTER — Telehealth: Payer: Self-pay | Admitting: Adult Health

## 2023-11-20 NOTE — Telephone Encounter (Signed)
Pt for a refill for NURTEC 75 MG TBDP refill was refused due to have not been seen since 2022. Pt ask why not because got refill in 1/22024. Informed pt for farther refills would need to schedule an appt. Pt stated will try to get refill from PCP.

## 2023-11-20 NOTE — Telephone Encounter (Signed)
Pt has not had an office visit since 03-01-21, she is now asking if appointment can be with Shanda Bumps, NP.  Pt was told since it has been over 2 years since she has been seen her f/u would need to be with Dr Lucia Gaskins.  Phone rep routing message to POD to see if pt's appointment can be with NP since there is an available appointment with her versus Dr Trevor Mace next available. Please advise

## 2023-11-22 NOTE — Telephone Encounter (Signed)
Thank you for notifying about this. I am okay to see her if she is just needing refills on her Nurtec but if her PCP was able to prescribe for her no reason for routine follow up in this office. Thank you.

## 2023-11-27 NOTE — Telephone Encounter (Signed)
Pt called to schehdule appt needed for refills

## 2023-11-28 ENCOUNTER — Encounter: Payer: Self-pay | Admitting: Adult Health

## 2023-11-28 ENCOUNTER — Ambulatory Visit: Payer: Managed Care, Other (non HMO) | Admitting: Adult Health

## 2023-11-28 VITALS — BP 129/73 | HR 64 | Ht 67.0 in | Wt 149.9 lb

## 2023-11-28 DIAGNOSIS — G43909 Migraine, unspecified, not intractable, without status migrainosus: Secondary | ICD-10-CM | POA: Diagnosis not present

## 2023-11-28 DIAGNOSIS — Z8673 Personal history of transient ischemic attack (TIA), and cerebral infarction without residual deficits: Secondary | ICD-10-CM | POA: Diagnosis not present

## 2023-11-28 MED ORDER — UBRELVY 100 MG PO TABS: 100.0000 mg | ORAL_TABLET | ORAL | 11 refills | Status: AC | PRN

## 2023-11-28 NOTE — Progress Notes (Signed)
[facility] [address] [place]. [place] [address] [phone]       OFFICE FOLLOW UP NOTE  [name] Date of Birth:  [birth year] Medical Record Number:  [id number]   Reason for visit: Migraine follow-up, hx of TIA    SUBJECTIVE:   CHIEF COMPLAINT:  Chief Complaint  Patient presents with   Follow-up    Patient in room #3 and alone. Patient states she is well and stable, with no new concerns.    HPI:    Update 11/28/2023 JM: Patient returns for follow-up visit after prior visit over 2.5 years ago.   Migraines typically 2-3 per month, takes Nurtec but sometimes will not resolve headache. Reports last month experiencing more frequent migraines which lasted for about 3 weeks with about 2-3 per week.   Denies new stroke/TIA symptoms.  Compliant on aspirin and Zetia.  Routinely follows with PCP for stroke risk factor management.     History provided for reference purposes only Update 03/01/2021 JM: Deanna Roberts returns for TIA follow-up.  Stable since prior visit without new or reoccurring stroke/TIA symptoms Compliant on aspirin and Zetia -denies side effects Blood pressure today 127/79 - does not monitor at home as typically stable  Chronic migraines stable - continues to have approx 2 migraines per month - use of Nurtec acutely typically subsiding migraine within 30 minutes   No new concerns at this time  Initial visit 09/21/2020 JM: Deanna Roberts is being seen for hospital follow-up.  She has been stable since discharge without new or reoccurring stroke/TIA symptoms.  Longstanding history of migraines without auras; she does report experiencing a migraine once every 2 weeks which can last for 2 to 3 days.  Typically located top of head on left side with a sharp stabbing sensation as well as photophobia, phonophobia, nausea and blurred vision.  She is able to maintain working but does have increased difficulty.  She does report previously being  treated for migraines but unsure what medication she previously tried.  Completed 3 weeks DAPT and remains on aspirin alone without bleeding or bruising.  Remains on Zetia 10 mg daily without side effects.  Blood pressure today 129/77.  No concerns at this time.  TIA admission 07/23/2020 Deanna Roberts is a 57 y.o. female with history of HTN, DM, CAD/MI in 2013, breast cancer admitted for episode of headache, neck pain, lightheadedness, confusion, nausea/vomiting, left-sided heaviness and dysmetria on 07/23/2020.  Personally reviewed hospitalization pertinent progress notes, labs and imaging.  tPA given by telemetry neurology.  CT head and MRI negative for acute abnormality and symptoms likely TIA vs complicated migraine vs anxiety.  MRA showed right M2 likely arthrosclerosis and negative for LVO.  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 Roda Shutters, subtle appearance I did not have MCA or ACA symptoms.  Recommended DAPT for 3 weeks and aspirin alone.  HTN stable and resumed home medications at discharge.  LDL 63 and recommended continuation of Zetia 10 mg daily.  Other stroke risk factors include CAD/MI in 2013 on aspirin PTA.  Evaluated by therapy and discharged home with recommendation of outpatient SLP.  TIA vs complicated migraine vs anxiety - s/p TPA Presented with headache, neck pain, lightheadedness, confusion, nausea/vomiting, shortness of breath, left-sided heaviness - more nonspecific when look back CT no acute abnormality MRI no acute infarct MRA right M2 likely atherosclerosis MRI repeat no acute infarct MRA neck no LVO, specifically no VA dissection. 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 2D Echo EF 55 to 60% LDL 63 HgbA1c 5.6 SCDs for VTE prophylaxis aspirin 81 mg daily prior to admission, now on ASA and Plavix DAPT for 3 weeks and then ASA alone. Patient counseled to be compliant with her  antithrombotic medications Ongoing aggressive stroke risk factor management Therapy recommendations: OP SLP Disposition: home      ROS:   14 system review of systems performed and negative with exception of migraines  PMH:  Past Medical History:  Diagnosis Date   Breast cancer (HCC)    Cancer (HCC)    Coronary artery disease    Diabetes mellitus without complication (HCC)    History of radiation therapy 05/30/18- 07/04/18   Left Breast 40.05 Gy in 15 fractions, Left Breast boost 5 Gy in 5 fractions.    Hypertension    Myocardial infarction [facility]) 2013    PSH:  Past Surgical History:  Procedure Laterality Date   ABDOMINAL HYSTERECTOMY     BREAST LUMPECTOMY Left 2019   BREAST LUMPECTOMY WITH RADIOACTIVE SEED LOCALIZATION Left 03/22/2018   Procedure: LEFT BREAST LUMPECTOMY WITH BRACKETED RADIOACTIVE SEED LOCALIZATION;  Surgeon: Harriette Bouillon, MD;  Location: [facility];  Service: General;  Laterality: Left;    Social History:  Social History   Socioeconomic History   Marital status: Single    Spouse name: Not on file   Number of children: Not on file   Years of education: Not on file   Highest education level: Not on file  Occupational History   Not on file  Tobacco Use   Smoking status: Never   Smokeless tobacco: Never   Tobacco comments:    she smoked in college  Vaping Use   Vaping status: Never Used  Substance and Sexual Activity   Alcohol use: Yes    Comment: occasional   Drug use: No   Sexual activity: Not on file  Other Topics Concern   Not on file  Social History Narrative   Not on file   Social Drivers of Health   Financial Resource Strain: Not on file  Food Insecurity: Not on file  Transportation Needs: Not on file  Physical Activity: Not on file  Stress: Not on file  Social Connections: Not on file  Intimate Partner Violence: Not At Risk (08/31/2018)   Humiliation, Afraid, Rape, and Kick questionnaire    Fear of Current or  Ex-Partner: No    Emotionally Abused: No    Physically Abused: No    Sexually Abused: No    Family History:  Family History  Problem Relation Age of Onset   Heart attack Mother    Breast cancer Mother 13   Multiple sclerosis Sister    Multiple sclerosis Sister     Medications:   Current Outpatient Medications on File Prior to Visit  Medication Sig Dispense Refill   aspirin 81 MG chewable tablet Chew 81 mg by mouth daily.     cetirizine (ZYRTEC) 10 MG tablet Take 10 mg by mouth daily.     clopidogrel (PLAVIX) 75 MG tablet Take 75 mg by mouth daily.     ezetimibe (ZETIA) 10 MG tablet Take 10 mg by mouth daily.   4   fluticasone (FLONASE) 50 MCG/ACT nasal spray Place 2 sprays into both nostrils daily.     hydrochlorothiazide (HYDRODIURIL) 25 MG tablet Take 25 mg by mouth daily.     HYDROcodone-acetaminophen (NORCO/VICODIN)  5-325 MG tablet Take 1 tablet by mouth every 6 (six) hours as needed for moderate pain. Prescribed by Dr. Vira Browns with [facility].     lisinopril (PRINIVIL,ZESTRIL) 10 MG tablet Take 10 mg by mouth daily.     NURTEC 75 MG TBDP TAKE 1 TABLET AT ONSET OF MIGRAINE, MAX DOSE OF 75MG /24HR 8 tablet 8   No current facility-administered medications on file prior to visit.    Allergies:   Allergies  Allergen Reactions   Pravastatin Other (See Comments)      OBJECTIVE:  Physical Exam  Vitals:   11/28/23 0833  BP: 129/73  Pulse: 64  Weight: 149 lb 14.4 oz (68 kg)  Height: 5\' 7"  (1.702 m)   Body mass index is 23.48 kg/m. No results found.  General: well developed, well nourished,  pleasant middle-aged African-American female, seated, in no evident distress  Neurologic Exam Mental Status: Awake and fully alert.   Fluent speech and language.  Oriented to place and time. Recent and remote memory intact. Attention span, concentration and fund of knowledge appropriate. Mood and affect appropriate.  Cranial Nerves: Pupils equal, briskly reactive  to light. Extraocular movements full without nystagmus. Visual fields full to confrontation. Hearing intact. Facial sensation intact. Face, tongue, palate moves normally and symmetrically.  Motor: Normal bulk and tone. Normal strength in all tested extremity muscles. Sensory.: intact to touch , pinprick , position and vibratory sensation.  Coordination: Rapid alternating movements normal in all extremities. Finger-to-nose and heel-to-shin performed accurately bilaterally. Gait and Station: Arises from chair without difficulty. Stance is normal. Gait demonstrates normal stride length and balance without use of AD Reflexes: 1+ and symmetric. Toes downgoing.         ASSESSMENT: Deanna Roberts is a 57 y.o. year old female presented with episode of headache, neck pain, lightheadedness, confusion, nausea/vomiting, left-sided heaviness and dysmetria on 07/23/2020 receiving TPA.  Stroke work-up negative for acute abnormality and likely TIA vs complicated migraine vs anxiety. Vascular risk factors include chronic migraines, HTN, HLD and CAD/MI 2013.      PLAN:  Chronic migraine: Typically, 2-3 per month. Recent increase, suspect weather related. Advised to call if migraines should increase in frequency again Recommend trying Ubrelvy 100mg  PRN, can repeat x1 after 2 hrs if needed, max dose 200mg /24hr.  No significant benefit with Nurtec Triptans contraindicated d/t TIA hx  TIA s/p tPA :  No new or reoccurring stroke/TIA symptoms Continue aspirin 81 mg daily  and Zetia 10 mg daily for secondary stroke prevention.  Discussed secondary stroke prevention measures and importance of close PCP follow up for aggressive stroke risk factor management including BP goal<130/90, and HLD with LDL goal<70     Follow up in 6 months or call earlier if needed   CC:  Truett Perna, MD    I spent 25 minutes of face-to-face and non-face-to-face time with patient.  This included previsit chart review, lab review,  study review, order entry, electronic health record documentation, patient education and discussion regarding above diagnoses and treatment plan and answered all other questions to patient's satisfaction  Ihor Austin, [facility]  [facility] [address] [place], [place] [address]  Phone [phone] Fax [phone] Note: This document was prepared with digital dictation and possible smart phrase technology. Any transcriptional errors that result from this process are unintentional.

## 2023-11-28 NOTE — Patient Instructions (Signed)
Your Plan:  Recommend trial of Ubrelvy for migraine rescue, can repeat after 2 hours if needed, max dose 200mg  per 24 hours  Do not use more than 2-3x per week or 8 times per month    Follow up in 6 months or call earlier if needed     Thank you for coming to see [place] at [facility]. I hope we have been able to provide you high quality care today.  You may receive a patient satisfaction survey over the next few weeks. We would appreciate your feedback and comments so that we may continue to improve ourselves and the health of our patients.

## 2023-12-01 ENCOUNTER — Telehealth: Payer: Self-pay

## 2023-12-01 ENCOUNTER — Other Ambulatory Visit (HOSPITAL_COMMUNITY): Payer: Self-pay

## 2023-12-01 NOTE — Telephone Encounter (Signed)
Pharmacy Patient Advocate Encounter  Received notification from [employer] that Prior Authorization for Ubrelvy 100MG  tablets has been APPROVED from 12/01/2023 to 11/30/2024. Ran test claim, Copay is $0. This test claim was processed through [facility]- copay amounts may vary at other pharmacies due to pharmacy/plan contracts, or as the patient moves through the different stages of their insurance plan.   PA #/Case ID/Reference #: PA Case ID #: [id number]  Key: [id number]

## 2024-04-17 ENCOUNTER — Other Ambulatory Visit: Payer: Self-pay | Admitting: Internal Medicine

## 2024-04-17 DIAGNOSIS — Z1231 Encounter for screening mammogram for malignant neoplasm of breast: Secondary | ICD-10-CM

## 2024-04-22 ENCOUNTER — Other Ambulatory Visit: Payer: Self-pay | Admitting: Neurology

## 2024-04-26 ENCOUNTER — Telehealth: Payer: Self-pay | Admitting: Pharmacist

## 2024-04-26 NOTE — Telephone Encounter (Signed)
 Pharmacy Patient Advocate Encounter  Received notification from [employer] that Prior Authorization for Nurtec 75MG  dispersible tablets has been APPROVED from 04/26/2024 to 04/26/2025   PA #/Case ID/Reference #: [id number]

## 2024-05-03 ENCOUNTER — Ambulatory Visit
Admission: RE | Admit: 2024-05-03 | Discharge: 2024-05-03 | Disposition: A | Discharge status: Home / Self Care | Source: Ambulatory Visit | Attending: Internal Medicine | Admitting: Internal Medicine

## 2024-05-03 DIAGNOSIS — Z1231 Encounter for screening mammogram for malignant neoplasm of breast: Secondary | ICD-10-CM

## 2024-06-10 ENCOUNTER — Telehealth: Payer: Self-pay | Admitting: Adult Health

## 2024-06-10 ENCOUNTER — Ambulatory Visit: Payer: Managed Care, Other (non HMO) | Admitting: Adult Health

## 2024-06-10 NOTE — Telephone Encounter (Signed)
 Patient to verify appointment

## 2024-09-30 ENCOUNTER — Ambulatory Visit: Admitting: Adult Health

## 2024-09-30 ENCOUNTER — Encounter: Payer: Self-pay | Admitting: Adult Health

## 2024-09-30 VITALS — BP 136/81 | HR 72 | Ht 67.0 in | Wt 145.0 lb

## 2024-09-30 DIAGNOSIS — G43909 Migraine, unspecified, not intractable, without status migrainosus: Secondary | ICD-10-CM

## 2024-09-30 DIAGNOSIS — Z8673 Personal history of transient ischemic attack (TIA), and cerebral infarction without residual deficits: Secondary | ICD-10-CM

## 2024-09-30 MED ORDER — TOPIRAMATE 25 MG PO TABS: ORAL_TABLET | ORAL | 0 refills | Status: AC

## 2024-09-30 NOTE — Patient Instructions (Addendum)
 Your Plan:  Start Topamax 25mg  twice daily for 14 days then increase to 50mg  twice daily - if you continue to have headaches after this time, please let me know.   Continue Ubrelvy  as needed for migraine rescue  Continue aspirin  and Zetia  for stroke prevention and continue routine follow up with your PCP and cardiologist for aggressive stroke risk factor management      Follow up in 6 months or call earlier if needed      Thank you for coming to see us  at [facility]. I hope we have been able to provide you high quality care today.  You may receive a patient satisfaction survey over the next few weeks. We would appreciate your feedback and comments so that we may continue to improve ourselves and the health of our patients.     Topiramate Tablets What is this medication? TOPIRAMATE (toe PYRE a mate) prevents and controls seizures in people with epilepsy. It may also be used to prevent migraine headaches. It works by calming overactive nerves in your body. This medicine may be used for other purposes; ask your health care provider or pharmacist if you have questions. COMMON BRAND NAME(S): Topamax, Topiragen What should I tell my care team before I take this medication? They need to know if you have any of these conditions: Bleeding disorder Kidney disease Lung disease Suicidal thoughts, plans, or attempt by you or a family member An unusual or allergic reaction to topiramate, other medications, foods, dyes, or preservatives Pregnant or trying to get pregnant Breast-feeding How should I use this medication? Take this medication by mouth with water. Take it as directed on the prescription label at the same time every day. Do not cut, crush or chew this medicine. Swallow the tablets whole. You can take it with or without food. If it upsets your stomach, take it with food. Keep taking it unless your care team tells you to stop. A special MedGuide will be given  to you by the pharmacist with each prescription and refill. Be sure to read this information carefully each time. Talk to your care team about the use of this medication in children. While it may be prescribed for children as young as 2 years for selected conditions, precautions do apply. Overdosage: If you think you have taken too much of this medicine contact a poison control center or emergency room at once. NOTE: This medicine is only for you. Do not share this medicine with others. What if I miss a dose? If you miss a dose, take it as soon as you can unless it is within 6 hours of the next dose. If it is within 6 hours of the next dose, skip the missed dose. Take the next dose at the normal time. Do not take double or extra doses. What may interact with this medication? Acetazolamide Alcohol Antihistamines for allergy, cough, and cold Aspirin  and aspirin -like medications Atropine Certain medications for anxiety or sleep Certain medications for bladder problems, such as oxybutynin, tolterodine Certain medications for depression, such as amitriptyline, fluoxetine, sertraline Certain medications for Parkinson disease, such as benztropine, trihexyphenidyl Certain medications for seizures, such as carbamazepine, lamotrigine, phenobarbital, phenytoin, primidone, valproic acid, zonisamide Certain medications for stomach problems, such as dicyclomine, hyoscyamine Certain medications for travel sickness, such as scopolamine  Certain medications that treat or prevent blood clots, such as warfarin, enoxaparin, dalteparin, apixaban, dabigatran, rivaroxaban Digoxin Diltiazem Estrogen and progestin hormones General anesthetics, such as halothane, isoflurane, methoxyflurane, propofol  Glyburide Hydrochlorothiazide Ipratropium  Lithium Medications that relax muscles Metformin NSAIDs, medications for pain and inflammation, such as ibuprofen  or naproxen Opioid medications for pain Phenothiazines,  such as chlorpromazine, mesoridazine, prochlorperazine, thioridazine Pioglitazone This list may not describe all possible interactions. Give your health care provider a list of all the medicines, herbs, non-prescription drugs, or dietary supplements you use. Also tell them if you smoke, drink alcohol, or use illegal drugs. Some items may interact with your medicine. What should I watch for while using this medication? Visit your care team for regular checks on your progress. Tell your care team if your symptoms do not start to get better or if they get worse. Do not suddenly stop taking this medication. You may develop a severe reaction. Your care team will tell you how much medication to take. If your care team wants you to stop the medication, the dose may be slowly lowered over time to avoid any side effects. Wear a medical ID bracelet or chain. Carry a card that describes your condition. List the medications and doses you take on the card. This medication may affect your coordination, reaction time, or judgment. Do not drive or operate machinery until you know how this medication affects you. Sit up or stand slowly to reduce the risk of dizzy or fainting spells. Drinking alcohol with this medication can increase the risk of these side effects. This medication may cause serious skin reactions. They can happen weeks to months after starting the medication. Contact your care team right away if you notice fevers or flu-like symptoms with a rash. The rash may be red or purple and then turn into blisters or peeling of the skin. You may also notice a red rash with swelling of the face, lips, or lymph nodes in your neck or under your arms. This medication may cause thoughts of suicide or depression. This includes sudden changes in mood, behaviors, or thoughts. These changes can happen at any time but are more common in the beginning of treatment or after a change in dose. Call your care team right away if you  experience these thoughts or worsening depression. This medication may slow your child's growth if it is taken for a long time at high doses. Your child's care team will monitor your child's growth. Using this medication for a long time may weaken your bones. The risk of bone fractures may be increased. Talk to your care team about your bone health. Discuss this medication with your care team if you may be pregnant. Serious birth defects can occur if you take this medication during pregnancy. There are benefits and risks to taking medications during pregnancy. Your care team can help you find the option that works for you. Contraception is recommended while taking this medication. Estrogen and progestin hormones may not work as well while you are taking this medication. Your care team can help you find the option that works for you. Talk to your care team before breastfeeding. Changes to your treatment plan may be needed. What side effects may I notice from receiving this medication? Side effects that you should report to your care team as soon as possible: Allergic reactions--skin rash, itching, hives, swelling of the face, lips, tongue, or throat High acid level--trouble breathing, unusual weakness or fatigue, confusion, headache, fast or irregular heartbeat, nausea, vomiting High ammonia level--unusual weakness or fatigue, confusion, loss of appetite, nausea, vomiting, seizures Fever that does not go away, decrease in sweat Kidney stones--blood in the urine, pain or trouble passing urine,  pain in the lower back or sides Redness, blistering, peeling or loosening of the skin, including inside the mouth Sudden eye pain or change in vision such as blurry vision, seeing halos around lights, vision loss Thoughts of suicide or self-harm, worsening mood, feelings of depression Side effects that usually do not require medical attention (report to your care team if they continue or are bothersome): Burning  or tingling sensation in hands or feet Difficulty with paying attention, memory, or speech Dizziness Drowsiness Fatigue Loss of appetite with weight loss Slow or sluggish movements of the body This list may not describe all possible side effects. Call your doctor for medical advice about side effects. You may report side effects to FDA at 1-800-FDA-1088. Where should I keep my medication? Keep out of the reach of children and pets. Store between 15 and 30 degrees C (59 and 86 degrees F). Protect from moisture. Keep the container tightly closed. Get rid of any unused medication after the expiration date. To get rid of medications that are no longer needed or have expired: Take the medication to a medication take-back program. Check with your pharmacy or law enforcement to find a location. If you cannot return the medication, check the label or package insert to see if the medication should be thrown out in the garbage or flushed down the toilet. If you are not sure, ask your care team. If it is safe to put it in the trash, empty the medication out of the container. Mix the medication with cat litter, dirt, coffee grounds, or other unwanted substance. Seal the mixture in a bag or container. Put it in the trash. NOTE: This sheet is a summary. It may not cover all possible information. If you have questions about this medicine, talk to your doctor, pharmacist, or health care provider.  2024 Elsevier/Gold Standard (2022-04-21 00:00:00)

## 2024-09-30 NOTE — Progress Notes (Signed)
 [facility] [address] [place]. [place] [address] [phone]       OFFICE FOLLOW UP NOTE  [name] Date of Birth:  July 31, 1966 Medical Record Number:  [id number]   Reason for visit: Migraine follow-up, hx of TIA    SUBJECTIVE:   CHIEF COMPLAINT:  Chief Complaint  Patient presents with   Transient Ischemic Attack    Rm 3 alon  Pt is well, reports her migraines have increased. She is having at least 2 a week.  No TIA concerns.    HPI:   Update 09/30/2024 JM: Patient returns for follow-up visit after prior visit 10 months ago.   She reports increase of her typical migraine headaches over the past 5 to 6 months, migraines occurring 2-3 times per week on average, sometimes can be more, migraines usually resolve after taking Ubrelvy .  Occasionally associated with vomiting.  Laying down in dark quiet room can also help.  She denies previously trying preventative medications.  She is currently on Toprol-XL 25 mg daily and lisinopril 10 mg daily by cardiology for HTN management.  Reports overall stable since prior visit without new stroke/TIA symptoms.  Reports compliance on aspirin  and Zetia . Routinely follows with PCP and cardiology for stroke risk factor management.         History provided for reference purposes only Update 11/28/2023 JM: Patient returns for follow-up visit after prior visit over 2.5 years ago.   Migraines typically 2-3 per month, takes Nurtec but sometimes will not resolve headache. Reports last month experiencing more frequent migraines which lasted for about 3 weeks with about 2-3 per week.   Denies new stroke/TIA symptoms.  Compliant on aspirin  and Zetia .  Routinely follows with PCP for stroke risk factor management.  Update 03/01/2021 JM: Deanna Roberts returns for TIA follow-up.  Stable since prior visit without new or reoccurring stroke/TIA symptoms Compliant on aspirin  and Zetia  -denies side effects Blood pressure  today 127/79 - does not monitor at home as typically stable  Chronic migraines stable - continues to have approx 2 migraines per month - use of Nurtec acutely typically subsiding migraine within 30 minutes   No new concerns at this time  Initial visit 09/21/2020 JM: Deanna Roberts is being seen for hospital follow-up.  She has been stable since discharge without new or reoccurring stroke/TIA symptoms.  Longstanding history of migraines without auras; she does report experiencing a migraine once every 2 weeks which can last for 2 to 3 days.  Typically located top of head on left side with a sharp stabbing sensation as well as photophobia, phonophobia, nausea and blurred vision.  She is able to maintain working but does have increased difficulty.  She does report previously being treated for migraines but unsure what medication she previously tried.  Completed 3 weeks DAPT and remains on aspirin  alone without bleeding or bruising.  Remains on Zetia  10 mg daily without side effects.  Blood pressure today 129/77.  No concerns at this time.  TIA admission 07/23/2020 Deanna Roberts is a 58 y.o. female with history of HTN, DM, CAD/MI in 2013, breast cancer admitted for episode of headache, neck pain, lightheadedness, confusion, nausea/vomiting, left-sided heaviness and dysmetria on 07/23/2020.  Personally reviewed hospitalization pertinent progress notes, labs and imaging.  tPA given by telemetry neurology.  CT head and MRI negative for acute abnormality and symptoms likely TIA vs complicated migraine vs anxiety.  MRA showed right M2 likely arthrosclerosis and negative for LVO.  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 Jerri, subtle appearance I did not have MCA or ACA symptoms.  Recommended DAPT for 3 weeks and aspirin  alone.  HTN stable and resumed home medications at discharge.  LDL 63 and recommended continuation of Zetia  10 mg daily.  Other stroke risk factors include CAD/MI in 2013 on  aspirin  PTA.  Evaluated by therapy and discharged home with recommendation of outpatient SLP.  TIA vs complicated migraine vs anxiety - s/p TPA Presented with headache, neck pain, lightheadedness, confusion, nausea/vomiting, shortness of breath, left-sided heaviness - more nonspecific when look back CT no acute abnormality MRI no acute infarct MRA right M2 likely atherosclerosis MRI repeat no acute infarct MRA neck no LVO, specifically no VA dissection. 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 2D Echo EF 55 to 60% LDL 63 HgbA1c 5.6 SCDs for VTE prophylaxis aspirin  81 mg daily prior to admission, now on ASA and Plavix  DAPT for 3 weeks and then ASA alone. Patient counseled to be compliant with her antithrombotic medications Ongoing aggressive stroke risk factor management Therapy recommendations: OP SLP Disposition: home      ROS:   14 system review of systems performed and negative with exception of migraines  PMH:  Past Medical History:  Diagnosis Date   Breast cancer (HCC)    Cancer (HCC)    Coronary artery disease    Diabetes mellitus without complication (HCC)    History of radiation therapy 05/30/18- 07/04/18   Left Breast 40.05 Gy in 15 fractions, Left Breast boost 5 Gy in 5 fractions.    Hypertension    Myocardial infarction [facility]) 2013    PSH:  Past Surgical History:  Procedure Laterality Date   ABDOMINAL HYSTERECTOMY     BREAST LUMPECTOMY Left 2019   BREAST LUMPECTOMY WITH RADIOACTIVE SEED LOCALIZATION Left 03/22/2018   Procedure: LEFT BREAST LUMPECTOMY WITH BRACKETED RADIOACTIVE SEED LOCALIZATION;  Surgeon: Vanderbilt Ned, MD;  Location: [facility];  Service: General;  Laterality: Left;    Social History:  Social History   Socioeconomic History   Marital status: Single    Spouse name: Not on file   Number of children: Not on file   Years of education: Not on file   Highest  education level: Not on file  Occupational History   Not on file  Tobacco Use   Smoking status: Never   Smokeless tobacco: Never   Tobacco comments:    she smoked in college  Vaping Use   Vaping status: Never Used  Substance and Sexual Activity   Alcohol use: Yes    Comment: occasional   Drug use: No   Sexual activity: Not on file  Other Topics Concern   Not on file  Social History Narrative   Not on file   Social Drivers of Health   Financial Resource Strain: Not on file  Food Insecurity: Low Risk  (09/20/2024)   Received from [facility]   Hunger Vital Sign    Within the past 12 months, you worried that your food would run out before you got money to buy more: Never true    Within the past 12 months, the food you bought just didn't last and you didn't have money to get more. : Never true  Transportation Needs: No Transportation Needs (09/20/2024)   Received from [facility]    In the past 12 months, has  lack of reliable transportation kept you from medical appointments, meetings, work or from getting things needed for daily living? : No  Physical Activity: Not on file  Stress: Not on file  Social Connections: Not on file  Intimate Partner Violence: Not At Risk (08/31/2018)   Humiliation, Afraid, Rape, and Kick questionnaire    Fear of Current or Ex-Partner: No    Emotionally Abused: No    Physically Abused: No    Sexually Abused: No    Family History:  Family History  Problem Relation Age of Onset   Heart attack Mother    Breast cancer Mother 74   Multiple sclerosis Sister    Multiple sclerosis Sister     Medications:   Current Outpatient Medications on File Prior to Visit  Medication Sig Dispense Refill   aspirin  81 MG chewable tablet Chew 81 mg by mouth daily.     cetirizine (ZYRTEC) 10 MG tablet Take 10 mg by mouth daily.     clopidogrel  (PLAVIX ) 75 MG tablet Take 75 mg by mouth daily.     ezetimibe  (ZETIA ) 10 MG tablet Take 10 mg by  mouth daily.   4   fluticasone (FLONASE) 50 MCG/ACT nasal spray Place 2 sprays into both nostrils daily.     hydrochlorothiazide (HYDRODIURIL) 25 MG tablet Take 25 mg by mouth daily.     HYDROcodone -acetaminophen  (NORCO/VICODIN) 5-325 MG tablet Take 1 tablet by mouth every 6 (six) hours as needed for moderate pain. Prescribed by Dr. Tobias Sharps with [facility].     lisinopril (PRINIVIL,ZESTRIL) 10 MG tablet Take 10 mg by mouth daily.     NURTEC 75 MG TBDP DISSOLVE 1 TABLET AT ONSET OF MIGRAINE, MAX DOSE OF 75MG /24HR 8 tablet 8   Ubrogepant  (UBRELVY ) 100 MG TABS Take 1 tablet (100 mg total) by mouth as needed (migraine). Can repeat after 2 hrs if needed, max dose 200mg /24hr 16 tablet 11   No current facility-administered medications on file prior to visit.    Allergies:   Allergies  Allergen Reactions   Pravastatin Other (See Comments)      OBJECTIVE:  Physical Exam  Vitals:   09/30/24 1501  BP: 136/81  Pulse: 72  Weight: 145 lb (65.8 kg)  Height: 5' 7 (1.702 m)    Body mass index is 22.71 kg/m. No results found.  General: well developed, well nourished,  pleasant middle-aged African-American female, seated, in no evident distress  Neurologic Exam Mental Status: Awake and fully alert.   Fluent speech and language.  Oriented to place and time. Recent and remote memory intact. Attention span, concentration and fund of knowledge appropriate. Mood and affect appropriate.  Cranial Nerves: Pupils equal, briskly reactive to light. Extraocular movements full without nystagmus. Visual fields full to confrontation. Hearing intact. Facial sensation intact. Face, tongue, palate moves normally and symmetrically.  Motor: Normal bulk and tone. Normal strength in all tested extremity muscles. Sensory.: intact to touch , pinprick , position and vibratory sensation.  Coordination: Rapid alternating movements normal in all extremities. Finger-to-nose and heel-to-shin performed  accurately bilaterally. Gait and Station: Arises from chair without difficulty. Stance is normal. Gait demonstrates normal stride length and balance without use of AD Reflexes: 1+ and symmetric. Toes downgoing.        ASSESSMENT: Deanna Roberts is a 58 y.o. year old female presented with episode of headache, neck pain, lightheadedness, confusion, nausea/vomiting, left-sided heaviness and dysmetria on 07/23/2020 receiving TPA.  Stroke work-up negative for acute abnormality and likely TIA  vs complicated migraine vs anxiety. Vascular risk factors include chronic migraines, HTN, HLD and CAD/MI 2013.      PLAN:  Chronic migraine: Worsening over the past 5-6 months, typical migraines, no red flag symptoms Recommend starting topiramate 25 mg twice daily for 2 weeks then increase to 50 mg twice daily.  Discussed potential side effects with additional information provided in AVS.   Continue Ubrelvy  100mg  PRN, can repeat x1 after 2 hrs if needed, max dose 200mg /24hr.  Currently on metoprolol per cardiology  If migraines persist, consider amitriptyline/nortriptyline or duloxetine No significant benefit with Nurtec Triptans contraindicated d/t TIA hx  TIA s/p tPA :  No new or reoccurring stroke/TIA symptoms Continue aspirin  81 mg daily  and Zetia  10 mg daily for secondary stroke prevention.  Discussed secondary stroke prevention measures and importance of close PCP follow up for aggressive stroke risk factor management including BP goal<130/90, and HLD with LDL goal<70     Follow up in 6 months or call earlier if needed   CC:  Cesario Mutton, MD      Harlene Bogaert, AGNP-BC  [facility] [address] [place], [place] [address]  Phone [phone] Fax [phone] Note: This document was prepared with digital dictation and possible smart phrase technology. Any transcriptional errors that result from this process are unintentional.

## 2024-10-31 ENCOUNTER — Other Ambulatory Visit (HOSPITAL_COMMUNITY): Payer: Self-pay

## 2024-10-31 ENCOUNTER — Telehealth: Payer: Self-pay

## 2024-11-08 ENCOUNTER — Other Ambulatory Visit (HOSPITAL_COMMUNITY): Payer: Self-pay

## 2025-04-28 ENCOUNTER — Ambulatory Visit: Admitting: Adult Health
